# Patient Record
Sex: Male | Born: 1935
Health system: Southern US, Community
[De-identification: ages and names within clinical notes are randomized; demographics above are authoritative.]

## PROBLEM LIST (undated history)

## (undated) DIAGNOSIS — C9000 Multiple myeloma not having achieved remission: Secondary | ICD-10-CM

## (undated) DIAGNOSIS — E785 Hyperlipidemia, unspecified: Secondary | ICD-10-CM

## (undated) DIAGNOSIS — I10 Essential (primary) hypertension: Secondary | ICD-10-CM

## (undated) HISTORY — DX: Essential (primary) hypertension: I10

## (undated) HISTORY — PX: JOINT REPLACEMENT: SHX530

## (undated) HISTORY — DX: Hyperlipidemia, unspecified: E78.5

## (undated) HISTORY — PX: EYE SURGERY: SHX253

---

## 2014-05-10 ENCOUNTER — Ambulatory Visit: Payer: Self-pay | Admitting: Gastroenterology

## 2014-06-27 ENCOUNTER — Ambulatory Visit: Payer: Self-pay | Admitting: Ophthalmology

## 2014-06-27 LAB — POTASSIUM: Potassium: 4.6 mmol/L (ref 3.5–5.1)

## 2014-07-25 ENCOUNTER — Ambulatory Visit: Payer: Self-pay | Admitting: Ophthalmology

## 2014-07-25 DIAGNOSIS — M109 Gout, unspecified: Secondary | ICD-10-CM | POA: Diagnosis not present

## 2014-07-25 DIAGNOSIS — H2511 Age-related nuclear cataract, right eye: Secondary | ICD-10-CM | POA: Diagnosis not present

## 2014-07-25 DIAGNOSIS — Z882 Allergy status to sulfonamides status: Secondary | ICD-10-CM | POA: Diagnosis not present

## 2014-07-25 DIAGNOSIS — Z96641 Presence of right artificial hip joint: Secondary | ICD-10-CM | POA: Diagnosis not present

## 2014-07-25 DIAGNOSIS — M1990 Unspecified osteoarthritis, unspecified site: Secondary | ICD-10-CM | POA: Diagnosis not present

## 2014-07-25 DIAGNOSIS — Z7982 Long term (current) use of aspirin: Secondary | ICD-10-CM | POA: Diagnosis not present

## 2014-07-25 DIAGNOSIS — Z8546 Personal history of malignant neoplasm of prostate: Secondary | ICD-10-CM | POA: Diagnosis not present

## 2014-07-25 DIAGNOSIS — Z96652 Presence of left artificial knee joint: Secondary | ICD-10-CM | POA: Diagnosis not present

## 2014-07-25 DIAGNOSIS — E780 Pure hypercholesterolemia: Secondary | ICD-10-CM | POA: Diagnosis not present

## 2014-07-25 DIAGNOSIS — R609 Edema, unspecified: Secondary | ICD-10-CM | POA: Diagnosis not present

## 2014-07-25 DIAGNOSIS — I10 Essential (primary) hypertension: Secondary | ICD-10-CM | POA: Diagnosis not present

## 2014-07-25 DIAGNOSIS — Z79899 Other long term (current) drug therapy: Secondary | ICD-10-CM | POA: Diagnosis not present

## 2014-07-25 DIAGNOSIS — K30 Functional dyspepsia: Secondary | ICD-10-CM | POA: Diagnosis not present

## 2014-07-25 DIAGNOSIS — Z885 Allergy status to narcotic agent status: Secondary | ICD-10-CM | POA: Diagnosis not present

## 2014-07-25 DIAGNOSIS — H2513 Age-related nuclear cataract, bilateral: Secondary | ICD-10-CM | POA: Diagnosis not present

## 2014-08-23 ENCOUNTER — Ambulatory Visit: Payer: Self-pay | Admitting: Ophthalmology

## 2014-08-23 DIAGNOSIS — Z01812 Encounter for preprocedural laboratory examination: Secondary | ICD-10-CM | POA: Diagnosis not present

## 2014-08-23 DIAGNOSIS — H2512 Age-related nuclear cataract, left eye: Secondary | ICD-10-CM | POA: Diagnosis not present

## 2014-09-03 ENCOUNTER — Ambulatory Visit: Payer: Self-pay | Admitting: Ophthalmology

## 2014-09-03 DIAGNOSIS — Z96659 Presence of unspecified artificial knee joint: Secondary | ICD-10-CM | POA: Diagnosis not present

## 2014-09-03 DIAGNOSIS — Z79899 Other long term (current) drug therapy: Secondary | ICD-10-CM | POA: Diagnosis not present

## 2014-09-03 DIAGNOSIS — M1990 Unspecified osteoarthritis, unspecified site: Secondary | ICD-10-CM | POA: Diagnosis not present

## 2014-09-03 DIAGNOSIS — Z8546 Personal history of malignant neoplasm of prostate: Secondary | ICD-10-CM | POA: Diagnosis not present

## 2014-09-03 DIAGNOSIS — I10 Essential (primary) hypertension: Secondary | ICD-10-CM | POA: Diagnosis not present

## 2014-09-03 DIAGNOSIS — M109 Gout, unspecified: Secondary | ICD-10-CM | POA: Diagnosis not present

## 2014-09-03 DIAGNOSIS — Z96649 Presence of unspecified artificial hip joint: Secondary | ICD-10-CM | POA: Diagnosis not present

## 2014-09-03 DIAGNOSIS — H269 Unspecified cataract: Secondary | ICD-10-CM | POA: Diagnosis not present

## 2014-09-03 DIAGNOSIS — Z882 Allergy status to sulfonamides status: Secondary | ICD-10-CM | POA: Diagnosis not present

## 2014-09-03 DIAGNOSIS — Z7982 Long term (current) use of aspirin: Secondary | ICD-10-CM | POA: Diagnosis not present

## 2014-09-03 DIAGNOSIS — Z885 Allergy status to narcotic agent status: Secondary | ICD-10-CM | POA: Diagnosis not present

## 2014-09-03 DIAGNOSIS — R609 Edema, unspecified: Secondary | ICD-10-CM | POA: Diagnosis not present

## 2014-09-03 DIAGNOSIS — H2512 Age-related nuclear cataract, left eye: Secondary | ICD-10-CM | POA: Diagnosis not present

## 2014-10-09 DIAGNOSIS — I129 Hypertensive chronic kidney disease with stage 1 through stage 4 chronic kidney disease, or unspecified chronic kidney disease: Secondary | ICD-10-CM | POA: Diagnosis not present

## 2014-10-09 DIAGNOSIS — M1A00X0 Idiopathic chronic gout, unspecified site, without tophus (tophi): Secondary | ICD-10-CM | POA: Diagnosis not present

## 2014-10-09 DIAGNOSIS — I10 Essential (primary) hypertension: Secondary | ICD-10-CM | POA: Diagnosis not present

## 2014-10-09 DIAGNOSIS — D631 Anemia in chronic kidney disease: Secondary | ICD-10-CM | POA: Diagnosis not present

## 2014-10-09 DIAGNOSIS — E785 Hyperlipidemia, unspecified: Secondary | ICD-10-CM | POA: Diagnosis not present

## 2014-10-09 DIAGNOSIS — L859 Epidermal thickening, unspecified: Secondary | ICD-10-CM | POA: Diagnosis not present

## 2014-10-09 DIAGNOSIS — L989 Disorder of the skin and subcutaneous tissue, unspecified: Secondary | ICD-10-CM | POA: Diagnosis not present

## 2014-11-09 DIAGNOSIS — I129 Hypertensive chronic kidney disease with stage 1 through stage 4 chronic kidney disease, or unspecified chronic kidney disease: Secondary | ICD-10-CM | POA: Diagnosis not present

## 2014-11-09 DIAGNOSIS — I10 Essential (primary) hypertension: Secondary | ICD-10-CM | POA: Diagnosis not present

## 2014-11-09 DIAGNOSIS — N183 Chronic kidney disease, stage 3 (moderate): Secondary | ICD-10-CM | POA: Diagnosis not present

## 2014-11-11 NOTE — Op Note (Signed)
PATIENT NAME:  Rodney Newton, KRAPF MR#:  [id number] DATE OF BIRTH:  1934/05/16  DATE OF PROCEDURE:  07/25/2014  PREOPERATIVE DIAGNOSIS: Cataract, right eye.   POSTOPERATIVE DIAGNOSIS:  Cataract, right eye.  PROCEDURE PERFORMED: Extracapsular cataract extraction using phacoemulsification with placement Alcon [id number], 20.0 diopter posterior chamber lens, serial number [id number].   ANESTHESIA: 4% lidocaine and 0.75% Marcaine a 50-50 mixture with 10 units/mL Hylenex given as a peribulbar.   SURGEON:  Loura Back. Sondra Blixt, MD  ASSISTANT:  None.  ANESTHESIOLOGIST: Gunnar Fusi, MD  COMPLICATIONS: None.   ESTIMATED BLOOD LOSS: Less than 1 mL.   DESCRIPTION OF PROCEDURE:  The patient was brought to the operating room and given a peribulbar block.  The patient was then prepped and draped in the usual fashion.  The vertical rectus muscles were imbricated using 5-0 silk sutures.  These sutures were then clamped to the sterile drapes as bridle sutures.  A limbal peritomy was performed extending two clock hours and hemostasis was obtained with cautery.  A partial thickness scleral groove was made at the surgical limbus and dissected anteriorly in a lamellar dissection using an Alcon crescent knife.  The anterior chamber was entered superonasally with a Superblade and through the lamellar dissection with a 2.6 mm keratome.  DisCoVisc was used to replace the aqueous and a continuous tear capsulorrhexis was carried out.  Hydrodissection and hydrodelineation were carried out with balanced salt and a 27 gauge canula.  The nucleus was rotated to confirm the effectiveness of the hydrodissection.  Phacoemulsification was carried out using a divide-and-conquer technique.  Total ultrasound time was 1 minute and 48 seconds with an average power of 28 percent. CDE of 50.23.  Irrigation/aspiration was used to remove the residual cortex.  DisCoVisc was used to inflate the capsule and the internal incision was  enlarged to 3 mm with the crescent knife.  The intraocular lens was folded and inserted into the capsular bag using the Alcon Acrysert delivery system.  Irrigation/aspiration was used to remove the residual DisCoVisc.  Miostat was injected into the anterior chamber through the paracentesis track to inflate the anterior chamber and induce miosis.  The wound was checked for leaks and none were found. The conjunctiva was closed with cautery and the bridle sutures were removed.  Cefuroxime 1/10th of a millilter containing 1 mg of drug was injected via the paracentesis track was placed in the eye. An eye shield was placed on the eye.  The patient was discharged to the recovery room in good condition.    ____________________________ Loura Back Daegen Berrocal, MD sad:kl D: 07/25/2014 16:12:04 ET T: 07/25/2014 16:57:03 ET JOB#: [id number]  cc: Remo Lipps A. Evyn Kooyman, MD, <Dictator> Martie Lee MD ELECTRONICALLY SIGNED 07/30/2014 13:20

## 2014-11-11 NOTE — Op Note (Signed)
PATIENT NAME:  Rodney Newton, Rodney Newton MR#:  [id number] DATE OF BIRTH:  [birth year]  DATE OF PROCEDURE:  09/03/2014  PREOPERATIVE DIAGNOSIS:  Nuclear sclerotic cataract, left eye.    POSTOPERATIVE DIAGNOSIS:  Nuclear sclerotic cataract, left eye.    PROCEDURE PERFORMED:  Extracapsular cataract extraction using phacoemulsification with placement of an Alcon SN6CWF, 20.0-diopter posterior chamber lens, serial #[id number].  SURGEON:  Loura Back. Karolynn Infantino, MD  ASSISTANT:  None.  ANESTHESIA:  4% lidocaine and 0.75% Marcaine in a 50/50 mixture with 10 units/mL of Hylenex added, given as a peribulbar.   ANESTHESIOLOGIST:  Julianne Handler, MD  COMPLICATIONS:  None.  ESTIMATED BLOOD LOSS:  Less than 1 ml.  DESCRIPTION OF PROCEDURE:  The patient was brought to the operating room and given a peribulbar block.  The patient was then prepped and draped in the usual fashion.  The vertical rectus muscles were imbricated using 5-0 silk sutures.  These sutures were then clamped to the sterile drapes as bridle sutures.  A limbal peritomy was performed extending two clock hours and hemostasis was obtained with cautery.  A partial thickness scleral groove was made at the surgical limbus and dissected anteriorly in a lamellar dissection using an Alcon crescent knife.  The anterior chamber was entered supero-temporally with a Superblade and through the lamellar dissection with a 2.6 mm keratome.  DisCoVisc was used to replace the aqueous and a continuous tear capsulorrhexis was carried out.  Hydrodissection and hydrodelineation were carried out with balanced salt and a 27 gauge canula.  The nucleus was rotated to confirm the effectiveness of the hydrodissection.  Phacoemulsification was carried out using a divide-and-conquer technique.  Total ultrasound time was 1 minute and 48 seconds with an average power of 24.3 percent and CDE of 43.43.  Irrigation/aspiration was used to remove the residual cortex.   DisCoVisc was used to inflate the capsule and the internal incision was enlarged to 3 mm with the crescent knife.  The intraocular lens was folded and inserted into the capsular bag using the AcrySert delivery system. Irrigation/aspiration was used to remove the residual DisCoVisc.  Miostat was injected into the anterior chamber through the paracentesis track to inflate the anterior chamber and induce miosis. A tenth of a milliliter of cefuroxime containing 1 mg of drug was injected via the paracentesis tract. The wound was checked for leaks and none were found. The conjunctiva was closed with cautery and the bridle sutures were removed.  Two drops of 0.3% Vigamox were placed on the eye.   An eye shield was placed on the eye.  The patient was discharged to the recovery room in good condition.  ____________________________ Loura Back Daryle Amis, MD sad:sb D: 09/03/2014 13:14:48 ET T: 09/03/2014 13:20:41 ET JOB#: [id number]  cc: Remo Lipps A. Kowen Kluth, MD, <Dictator> Martie Lee MD ELECTRONICALLY SIGNED 09/03/2014 13:51

## 2015-02-04 DIAGNOSIS — M545 Low back pain: Secondary | ICD-10-CM | POA: Diagnosis not present

## 2015-02-04 DIAGNOSIS — M5416 Radiculopathy, lumbar region: Secondary | ICD-10-CM | POA: Diagnosis not present

## 2015-02-19 DIAGNOSIS — M533 Sacrococcygeal disorders, not elsewhere classified: Secondary | ICD-10-CM | POA: Diagnosis not present

## 2015-03-08 DIAGNOSIS — M5441 Lumbago with sciatica, right side: Secondary | ICD-10-CM | POA: Diagnosis not present

## 2015-03-20 DIAGNOSIS — M533 Sacrococcygeal disorders, not elsewhere classified: Secondary | ICD-10-CM | POA: Diagnosis not present

## 2015-04-04 DIAGNOSIS — M5416 Radiculopathy, lumbar region: Secondary | ICD-10-CM | POA: Diagnosis not present

## 2015-04-04 DIAGNOSIS — M5136 Other intervertebral disc degeneration, lumbar region: Secondary | ICD-10-CM | POA: Diagnosis not present

## 2015-04-11 DIAGNOSIS — Z961 Presence of intraocular lens: Secondary | ICD-10-CM | POA: Diagnosis not present

## 2015-04-12 DIAGNOSIS — E785 Hyperlipidemia, unspecified: Secondary | ICD-10-CM | POA: Insufficient documentation

## 2015-04-12 DIAGNOSIS — I10 Essential (primary) hypertension: Secondary | ICD-10-CM | POA: Insufficient documentation

## 2015-04-15 ENCOUNTER — Ambulatory Visit (INDEPENDENT_AMBULATORY_CARE_PROVIDER_SITE_OTHER): Payer: Medicare Other | Admitting: Family Medicine

## 2015-04-15 ENCOUNTER — Encounter: Payer: Self-pay | Admitting: Family Medicine

## 2015-04-15 VITALS — BP 119/66 | HR 51 | Temp 97.5°F | Ht 64.2 in | Wt 174.0 lb

## 2015-04-15 DIAGNOSIS — I129 Hypertensive chronic kidney disease with stage 1 through stage 4 chronic kidney disease, or unspecified chronic kidney disease: Secondary | ICD-10-CM

## 2015-04-15 DIAGNOSIS — Z0000 Encounter for general adult medical examination without abnormal findings: Secondary | ICD-10-CM | POA: Diagnosis not present

## 2015-04-15 DIAGNOSIS — M10372 Gout due to renal impairment, left ankle and foot: Secondary | ICD-10-CM | POA: Diagnosis not present

## 2015-04-15 DIAGNOSIS — C61 Malignant neoplasm of prostate: Secondary | ICD-10-CM | POA: Diagnosis not present

## 2015-04-15 DIAGNOSIS — N183 Chronic kidney disease, stage 3 unspecified: Secondary | ICD-10-CM

## 2015-04-15 DIAGNOSIS — Z23 Encounter for immunization: Secondary | ICD-10-CM

## 2015-04-15 DIAGNOSIS — M109 Gout, unspecified: Secondary | ICD-10-CM | POA: Insufficient documentation

## 2015-04-15 DIAGNOSIS — D631 Anemia in chronic kidney disease: Secondary | ICD-10-CM | POA: Diagnosis not present

## 2015-04-15 DIAGNOSIS — E785 Hyperlipidemia, unspecified: Secondary | ICD-10-CM

## 2015-04-15 DIAGNOSIS — I10 Essential (primary) hypertension: Secondary | ICD-10-CM | POA: Diagnosis not present

## 2015-04-15 DIAGNOSIS — N189 Chronic kidney disease, unspecified: Secondary | ICD-10-CM

## 2015-04-15 LAB — URINALYSIS, ROUTINE W REFLEX MICROSCOPIC
BILIRUBIN UA: NEGATIVE
Glucose, UA: NEGATIVE
Ketones, UA: NEGATIVE
Leukocytes, UA: NEGATIVE
NITRITE UA: NEGATIVE
PH UA: 5 (ref 5.0–7.5)
Protein, UA: NEGATIVE
RBC UA: NEGATIVE
SPEC GRAV UA: 1.02 (ref 1.005–1.030)
UUROB: 0.2 mg/dL (ref 0.2–1.0)

## 2015-04-15 MED ORDER — QUINAPRIL HCL 40 MG PO TABS: 40.0000 mg | ORAL_TABLET | Freq: Every day | ORAL | Status: DC

## 2015-04-15 MED ORDER — ALLOPURINOL 100 MG PO TABS: 100.0000 mg | ORAL_TABLET | Freq: Every day | ORAL | Status: DC

## 2015-04-15 MED ORDER — ATORVASTATIN CALCIUM 40 MG PO TABS: 40.0000 mg | ORAL_TABLET | Freq: Every day | ORAL | Status: DC

## 2015-04-15 MED ORDER — AMLODIPINE BESYLATE 10 MG PO TABS: 10.0000 mg | ORAL_TABLET | Freq: Every day | ORAL | Status: DC

## 2015-04-15 MED ORDER — CARVEDILOL 25 MG PO TABS: 50.0000 mg | ORAL_TABLET | Freq: Two times a day (BID) | ORAL | Status: DC

## 2015-04-15 NOTE — Assessment & Plan Note (Signed)
Followed by nephrology. 

## 2015-04-15 NOTE — Assessment & Plan Note (Signed)
The current medical regimen is effective;  continue present plan and medications.  

## 2015-04-15 NOTE — Assessment & Plan Note (Signed)
Checking labs today

## 2015-04-15 NOTE — Progress Notes (Signed)
BP 119/66 mmHg  Pulse 51  Temp(Src) 97.5 F (36.4 C)  Ht 5' 4.2" (1.631 m)  Wt 174 lb (78.926 kg)  BMI 29.67 kg/m2  SpO2 99%   Subjective:    Patient ID: Rodney Newton, male    DOB: Dec 01, 1933, 81 y.o.   MRN: [id number]  HPI: Rodney Newton is a 81 y.o. male  Chief Complaint  Patient presents with  . Annual Exam   patient doing well except for back pain. Back pain is been followed at [facility] in the middle of workup with MRI etc. has had some shots with no real relief having some radicular symptoms most likely degenerative disc disease.  Patient followed at nephrology on chart review last notes from 2015 patient with no complaints from anemia and stated his last renal function was slightly improved. On chart review did gave him some Naprosyn but is not taking that for his back due to his kidneys. I encouraged him to stay off the Naprosyn.  Blood pressure is maintained good with no complaints from medications No fluid retention from amodiine there side efs takesmedicines fitully.  Doing well with Lipitor for cholesterol  Patient not taking tramadol or gabapentin anymore reviewed from medicine last  Relevant past medical, surgical, family and social history reviewed and updated as indicated. Interim medical history since our last visit reviewed. Allergies and medications reviewed and updated.  Review of Systems  Constitutional: Negative.   HENT: Negative.   Eyes: Negative.   Respiratory: Negative.   Cardiovascular: Negative.   Gastrointestinal: Negative.   Endocrine: Negative.   Genitourinary: Negative.   Musculoskeletal: Negative.   Skin: Negative.   Allergic/Immunologic: Negative.   Neurological: Negative.   Hematological: Negative.   Psychiatric/Behavioral: Negative.     Per HPI unless specifically indicated above     Objective:    BP 119/66 mmHg  Pulse 51  Temp(Src) 97.5 F (36.4 C)  Ht 5' 4.2" (1.631 m)  Wt 174 lb (78.926 kg)  BMI 29.67 kg/m2  SpO2  99%  Wt Readings from Last 3 Encounters:  04/15/15 174 lb (78.926 kg)  10/09/14 187 lb (84.823 kg)    Physical Exam  Constitutional: He is oriented to person, place, and time. He appears well-developed and well-nourished.  HENT:  Head: Normocephalic and atraumatic.  Right Ear: External ear normal.  Left Ear: External ear normal.  Eyes: Conjunctivae and EOM are normal. Pupils are equal, round, and reactive to light.  Neck: Normal range of motion. Neck supple.  Cardiovascular: Normal rate, regular rhythm, normal heart sounds and intact distal pulses.   Pulmonary/Chest: Effort normal and breath sounds normal.  Abdominal: Soft. Bowel sounds are normal. There is no splenomegaly or hepatomegaly.  Genitourinary: Rectum normal and penis normal.  Musculoskeletal: Normal range of motion.  Neurological: He is alert and oriented to person, place, and time. He has normal reflexes.  Skin: No rash noted. No erythema.  Psychiatric: He has a normal mood and affect. His behavior is normal. Judgment and thought content normal.    Results for orders placed or performed in visit on 06/27/14  Potassium  Result Value Ref Range   Potassium 4.6 3.5-5.1 mmol/L      Assessment & Plan:   Problem List Items Addressed This Visit      Cardiovascular and Mediastinum   Hypertension    The current medical regimen is effective;  continue present plan and medications.       Relevant Medications   aspirin EC 81  MG tablet   quinapril (ACCUPRIL) 40 MG tablet   carvedilol (COREG) 25 MG tablet   atorvastatin (LIPITOR) 40 MG tablet   amLODipine (NORVASC) 10 MG tablet   Other Relevant Orders   Comprehensive metabolic panel   TSH     Genitourinary   Hypertensive kidney disease with stage 3 chronic kidney disease    Followed by nephrology      Relevant Medications   quinapril (ACCUPRIL) 40 MG tablet   carvedilol (COREG) 25 MG tablet   amLODipine (NORVASC) 10 MG tablet   Other Relevant Orders    Comprehensive metabolic panel   Urinalysis, Routine w reflex microscopic (not at [facility])   TSH   Anemia of chronic renal failure    Checking labs today      Relevant Medications   vitamin B-12 (CYANOCOBALAMIN) 1000 MCG tablet   Other Relevant Orders   CBC with Differential/Platelet   Urinalysis, Routine w reflex microscopic (not at [facility])   TSH   Prostate CA ([place])   Relevant Medications   aspirin EC 81 MG tablet   Other Relevant Orders   TSH     Other   Hyperlipidemia    The current medical regimen is effective;  continue present plan and medications.       Relevant Medications   aspirin EC 81 MG tablet   quinapril (ACCUPRIL) 40 MG tablet   carvedilol (COREG) 25 MG tablet   atorvastatin (LIPITOR) 40 MG tablet   amLODipine (NORVASC) 10 MG tablet   Other Relevant Orders   Comprehensive metabolic panel   Lipid panel   TSH   Gout    The current medical regimen is effective;  continue present plan and medications.       Relevant Medications   allopurinol (ZYLOPRIM) 100 MG tablet   Other Relevant Orders   Comprehensive metabolic panel   TSH    Other Visit Diagnoses    PE (physical exam), annual    -  Primary    Immunization due        Relevant Orders    Flu Vaccine QUAD 36+ mos PF IM (Fluarix & Fluzone Quad PF)       Medicare wellness visit metrics all done Follow up plan: Return in about 6 months (around 10/14/2015), or if symptoms worsen or fail to improve, for BMP, CBC, lipid panel, ALT, AST.

## 2015-04-16 ENCOUNTER — Other Ambulatory Visit: Payer: Self-pay | Admitting: Family Medicine

## 2015-04-16 DIAGNOSIS — D649 Anemia, unspecified: Secondary | ICD-10-CM

## 2015-04-16 LAB — CBC WITH DIFFERENTIAL/PLATELET
BASOS ABS: 0 10*3/uL (ref 0.0–0.2)
Basos: 0 %
EOS (ABSOLUTE): 0.1 10*3/uL (ref 0.0–0.4)
Eos: 3 %
HEMOGLOBIN: 8.9 g/dL — AB (ref 12.6–17.7)
Hematocrit: 26.8 % — ABNORMAL LOW (ref 37.5–51.0)
Immature Grans (Abs): 0 10*3/uL (ref 0.0–0.1)
Immature Granulocytes: 1 %
LYMPHS ABS: 0.8 10*3/uL (ref 0.7–3.1)
Lymphs: 27 %
MCH: 36.6 pg — AB (ref 26.6–33.0)
MCHC: 33.2 g/dL (ref 31.5–35.7)
MCV: 110 fL — ABNORMAL HIGH (ref 79–97)
MONOCYTES: 10 %
MONOS ABS: 0.3 10*3/uL (ref 0.1–0.9)
NEUTROS ABS: 1.7 10*3/uL (ref 1.4–7.0)
Neutrophils: 59 %
Platelets: 262 10*3/uL (ref 150–379)
RBC: 2.43 x10E6/uL — CL (ref 4.14–5.80)
RDW: 15.2 % (ref 12.3–15.4)
WBC: 2.9 10*3/uL — ABNORMAL LOW (ref 3.4–10.8)

## 2015-04-16 LAB — COMPREHENSIVE METABOLIC PANEL
ALBUMIN: 3.4 g/dL — AB (ref 3.5–4.8)
ALT: 17 IU/L (ref 0–44)
AST: 11 IU/L (ref 0–40)
Albumin/Globulin Ratio: 0.7 — ABNORMAL LOW (ref 1.1–2.5)
Alkaline Phosphatase: 118 IU/L — ABNORMAL HIGH (ref 39–117)
BUN / CREAT RATIO: 16 (ref 10–22)
BUN: 26 mg/dL (ref 8–27)
Bilirubin Total: 0.4 mg/dL (ref 0.0–1.2)
CALCIUM: 9.5 mg/dL (ref 8.6–10.2)
CO2: 19 mmol/L (ref 18–29)
CREATININE: 1.67 mg/dL — AB (ref 0.76–1.27)
Chloride: 96 mmol/L — ABNORMAL LOW (ref 97–108)
GFR calc Af Amer: 44 mL/min/{1.73_m2} — ABNORMAL LOW (ref >59)
GFR, EST NON AFRICAN AMERICAN: 38 mL/min/{1.73_m2} — AB (ref >59)
GLOBULIN, TOTAL: 4.7 g/dL — AB (ref 1.5–4.5)
GLUCOSE: 120 mg/dL — AB (ref 65–99)
Potassium: 4.9 mmol/L (ref 3.5–5.2)
SODIUM: 128 mmol/L — AB (ref 134–144)
TOTAL PROTEIN: 8.1 g/dL (ref 6.0–8.5)

## 2015-04-16 LAB — LIPID PANEL
CHOL/HDL RATIO: 2.7 ratio (ref 0.0–5.0)
CHOLESTEROL TOTAL: 131 mg/dL (ref 100–199)
HDL: 49 mg/dL (ref >39)
LDL CALC: 61 mg/dL (ref 0–99)
Triglycerides: 106 mg/dL (ref 0–149)
VLDL CHOLESTEROL CAL: 21 mg/dL (ref 5–40)

## 2015-04-16 LAB — TSH: TSH: 2.26 u[IU]/mL (ref 0.450–4.500)

## 2015-04-17 DIAGNOSIS — M8448XA Pathological fracture, other site, initial encounter for fracture: Secondary | ICD-10-CM | POA: Diagnosis not present

## 2015-04-17 DIAGNOSIS — M4806 Spinal stenosis, lumbar region: Secondary | ICD-10-CM | POA: Diagnosis not present

## 2015-04-17 DIAGNOSIS — M5137 Other intervertebral disc degeneration, lumbosacral region: Secondary | ICD-10-CM | POA: Diagnosis not present

## 2015-04-17 DIAGNOSIS — M5136 Other intervertebral disc degeneration, lumbar region: Secondary | ICD-10-CM | POA: Diagnosis not present

## 2015-04-18 ENCOUNTER — Other Ambulatory Visit: Payer: Medicare Other

## 2015-04-18 DIAGNOSIS — D649 Anemia, unspecified: Secondary | ICD-10-CM | POA: Diagnosis not present

## 2015-04-19 ENCOUNTER — Encounter: Payer: Self-pay | Admitting: Family Medicine

## 2015-04-19 ENCOUNTER — Telehealth: Payer: Self-pay | Admitting: Family Medicine

## 2015-04-19 LAB — CBC WITH DIFFERENTIAL/PLATELET
BASOS: 0 %
Basophils Absolute: 0 10*3/uL (ref 0.0–0.2)
EOS (ABSOLUTE): 0 10*3/uL (ref 0.0–0.4)
EOS: 1 %
HEMATOCRIT: 28.9 % — AB (ref 37.5–51.0)
HEMOGLOBIN: 9.5 g/dL — AB (ref 12.6–17.7)
IMMATURE GRANULOCYTES: 0 %
Immature Grans (Abs): 0 10*3/uL (ref 0.0–0.1)
Lymphocytes Absolute: 1 10*3/uL (ref 0.7–3.1)
Lymphs: 22 %
MCH: 37.3 pg — ABNORMAL HIGH (ref 26.6–33.0)
MCHC: 32.9 g/dL (ref 31.5–35.7)
MCV: 113 fL — AB (ref 79–97)
MONOCYTES: 7 %
MONOS ABS: 0.3 10*3/uL (ref 0.1–0.9)
NEUTROS PCT: 70 %
Neutrophils Absolute: 3 10*3/uL (ref 1.4–7.0)
Platelets: 307 10*3/uL (ref 150–379)
RBC: 2.55 x10E6/uL — AB (ref 4.14–5.80)
RDW: 15.4 % (ref 12.3–15.4)
WBC: 4.3 10*3/uL (ref 3.4–10.8)

## 2015-04-19 LAB — RETICULOCYTES: RETIC CT PCT: 1.5 % (ref 0.6–2.6)

## 2015-04-19 LAB — IRON AND TIBC
IRON: 92 ug/dL (ref 38–169)
Iron Saturation: 38 % (ref 15–55)
TIBC: 239 ug/dL — AB (ref 250–450)
UIBC: 147 ug/dL (ref 111–343)

## 2015-04-19 LAB — FERRITIN: Ferritin: 583 ng/mL — ABNORMAL HIGH (ref 30–400)

## 2015-04-19 NOTE — Telephone Encounter (Signed)
-----   Message from Wynn Maudlin, [place] sent at 04/19/2015 10:18 AM EDT ----- labs

## 2015-04-19 NOTE — Telephone Encounter (Signed)
Phone call Discussed with patient hemoglobin stable but low with no iron deficiency and medications patient has appointment with hematology later this month. Will review with nephrology. Patient may need hematology consult also.

## 2015-04-23 ENCOUNTER — Other Ambulatory Visit: Payer: Self-pay | Admitting: Family Medicine

## 2015-04-24 DIAGNOSIS — C61 Malignant neoplasm of prostate: Secondary | ICD-10-CM | POA: Diagnosis not present

## 2015-04-24 DIAGNOSIS — N189 Chronic kidney disease, unspecified: Secondary | ICD-10-CM | POA: Diagnosis not present

## 2015-04-24 DIAGNOSIS — R222 Localized swelling, mass and lump, trunk: Secondary | ICD-10-CM | POA: Diagnosis not present

## 2015-04-24 DIAGNOSIS — M533 Sacrococcygeal disorders, not elsewhere classified: Secondary | ICD-10-CM | POA: Diagnosis not present

## 2015-04-26 DIAGNOSIS — R938 Abnormal findings on diagnostic imaging of other specified body structures: Secondary | ICD-10-CM | POA: Diagnosis not present

## 2015-04-26 DIAGNOSIS — M8448XA Pathological fracture, other site, initial encounter for fracture: Secondary | ICD-10-CM | POA: Diagnosis not present

## 2015-04-26 DIAGNOSIS — S32059A Unspecified fracture of fifth lumbar vertebra, initial encounter for closed fracture: Secondary | ICD-10-CM | POA: Diagnosis not present

## 2015-05-01 DIAGNOSIS — R222 Localized swelling, mass and lump, trunk: Secondary | ICD-10-CM | POA: Diagnosis not present

## 2015-05-01 DIAGNOSIS — M899 Disorder of bone, unspecified: Secondary | ICD-10-CM | POA: Diagnosis not present

## 2015-05-01 DIAGNOSIS — C61 Malignant neoplasm of prostate: Secondary | ICD-10-CM | POA: Diagnosis not present

## 2015-05-01 DIAGNOSIS — C9000 Multiple myeloma not having achieved remission: Secondary | ICD-10-CM | POA: Diagnosis not present

## 2015-05-01 DIAGNOSIS — Z79899 Other long term (current) drug therapy: Secondary | ICD-10-CM | POA: Diagnosis not present

## 2015-05-01 DIAGNOSIS — Z1379 Encounter for other screening for genetic and chromosomal anomalies: Secondary | ICD-10-CM | POA: Diagnosis not present

## 2015-05-10 DIAGNOSIS — D649 Anemia, unspecified: Secondary | ICD-10-CM | POA: Diagnosis not present

## 2015-05-10 DIAGNOSIS — I10 Essential (primary) hypertension: Secondary | ICD-10-CM | POA: Diagnosis not present

## 2015-05-10 DIAGNOSIS — N183 Chronic kidney disease, stage 3 (moderate): Secondary | ICD-10-CM | POA: Diagnosis not present

## 2015-05-13 DIAGNOSIS — D472 Monoclonal gammopathy: Secondary | ICD-10-CM | POA: Diagnosis not present

## 2015-05-13 DIAGNOSIS — C9000 Multiple myeloma not having achieved remission: Secondary | ICD-10-CM | POA: Diagnosis not present

## 2015-05-15 DIAGNOSIS — C9000 Multiple myeloma not having achieved remission: Secondary | ICD-10-CM | POA: Diagnosis not present

## 2015-05-20 DIAGNOSIS — Z0183 Encounter for blood typing: Secondary | ICD-10-CM | POA: Diagnosis not present

## 2015-05-20 DIAGNOSIS — N183 Chronic kidney disease, stage 3 (moderate): Secondary | ICD-10-CM | POA: Diagnosis not present

## 2015-05-20 DIAGNOSIS — B029 Zoster without complications: Secondary | ICD-10-CM | POA: Diagnosis not present

## 2015-05-20 DIAGNOSIS — Z5111 Encounter for antineoplastic chemotherapy: Secondary | ICD-10-CM | POA: Diagnosis not present

## 2015-05-20 DIAGNOSIS — I10 Essential (primary) hypertension: Secondary | ICD-10-CM | POA: Diagnosis not present

## 2015-05-20 DIAGNOSIS — Z79899 Other long term (current) drug therapy: Secondary | ICD-10-CM | POA: Diagnosis not present

## 2015-05-20 DIAGNOSIS — K5900 Constipation, unspecified: Secondary | ICD-10-CM | POA: Diagnosis not present

## 2015-05-20 DIAGNOSIS — N179 Acute kidney failure, unspecified: Secondary | ICD-10-CM | POA: Diagnosis not present

## 2015-05-20 DIAGNOSIS — K279 Peptic ulcer, site unspecified, unspecified as acute or chronic, without hemorrhage or perforation: Secondary | ICD-10-CM | POA: Diagnosis not present

## 2015-05-20 DIAGNOSIS — C9000 Multiple myeloma not having achieved remission: Secondary | ICD-10-CM | POA: Diagnosis not present

## 2015-05-21 DIAGNOSIS — C9000 Multiple myeloma not having achieved remission: Secondary | ICD-10-CM | POA: Diagnosis not present

## 2015-05-27 DIAGNOSIS — C9000 Multiple myeloma not having achieved remission: Secondary | ICD-10-CM | POA: Diagnosis not present

## 2015-06-03 DIAGNOSIS — C9000 Multiple myeloma not having achieved remission: Secondary | ICD-10-CM | POA: Diagnosis not present

## 2015-06-03 DIAGNOSIS — Z5112 Encounter for antineoplastic immunotherapy: Secondary | ICD-10-CM | POA: Diagnosis not present

## 2015-06-10 DIAGNOSIS — E785 Hyperlipidemia, unspecified: Secondary | ICD-10-CM | POA: Diagnosis not present

## 2015-06-10 DIAGNOSIS — C9000 Multiple myeloma not having achieved remission: Secondary | ICD-10-CM | POA: Diagnosis not present

## 2015-06-10 DIAGNOSIS — I10 Essential (primary) hypertension: Secondary | ICD-10-CM | POA: Diagnosis not present

## 2015-06-10 DIAGNOSIS — D539 Nutritional anemia, unspecified: Secondary | ICD-10-CM | POA: Diagnosis not present

## 2015-06-10 DIAGNOSIS — Z5111 Encounter for antineoplastic chemotherapy: Secondary | ICD-10-CM | POA: Diagnosis not present

## 2015-06-10 DIAGNOSIS — Z5112 Encounter for antineoplastic immunotherapy: Secondary | ICD-10-CM | POA: Diagnosis not present

## 2015-06-17 DIAGNOSIS — C9000 Multiple myeloma not having achieved remission: Secondary | ICD-10-CM | POA: Diagnosis not present

## 2015-06-17 DIAGNOSIS — D649 Anemia, unspecified: Secondary | ICD-10-CM | POA: Diagnosis not present

## 2015-06-24 DIAGNOSIS — C9000 Multiple myeloma not having achieved remission: Secondary | ICD-10-CM | POA: Diagnosis not present

## 2015-07-01 DIAGNOSIS — M545 Low back pain: Secondary | ICD-10-CM | POA: Diagnosis not present

## 2015-07-01 DIAGNOSIS — E785 Hyperlipidemia, unspecified: Secondary | ICD-10-CM | POA: Diagnosis not present

## 2015-07-01 DIAGNOSIS — D539 Nutritional anemia, unspecified: Secondary | ICD-10-CM | POA: Diagnosis not present

## 2015-07-01 DIAGNOSIS — I10 Essential (primary) hypertension: Secondary | ICD-10-CM | POA: Diagnosis not present

## 2015-07-01 DIAGNOSIS — C9000 Multiple myeloma not having achieved remission: Secondary | ICD-10-CM | POA: Diagnosis not present

## 2015-07-01 DIAGNOSIS — K5900 Constipation, unspecified: Secondary | ICD-10-CM | POA: Diagnosis not present

## 2015-07-09 DIAGNOSIS — C9000 Multiple myeloma not having achieved remission: Secondary | ICD-10-CM | POA: Diagnosis not present

## 2015-07-16 DIAGNOSIS — C9000 Multiple myeloma not having achieved remission: Secondary | ICD-10-CM | POA: Diagnosis not present

## 2015-07-25 DIAGNOSIS — K5900 Constipation, unspecified: Secondary | ICD-10-CM | POA: Diagnosis not present

## 2015-07-25 DIAGNOSIS — C9000 Multiple myeloma not having achieved remission: Secondary | ICD-10-CM | POA: Diagnosis not present

## 2015-07-25 DIAGNOSIS — R5382 Chronic fatigue, unspecified: Secondary | ICD-10-CM | POA: Diagnosis not present

## 2015-07-25 DIAGNOSIS — I10 Essential (primary) hypertension: Secondary | ICD-10-CM | POA: Diagnosis not present

## 2015-07-25 DIAGNOSIS — E785 Hyperlipidemia, unspecified: Secondary | ICD-10-CM | POA: Diagnosis not present

## 2015-07-25 DIAGNOSIS — D539 Nutritional anemia, unspecified: Secondary | ICD-10-CM | POA: Diagnosis not present

## 2015-07-25 DIAGNOSIS — Z5111 Encounter for antineoplastic chemotherapy: Secondary | ICD-10-CM | POA: Diagnosis not present

## 2015-07-25 DIAGNOSIS — C9001 Multiple myeloma in remission: Secondary | ICD-10-CM | POA: Diagnosis not present

## 2015-07-31 DIAGNOSIS — C9000 Multiple myeloma not having achieved remission: Secondary | ICD-10-CM | POA: Diagnosis not present

## 2015-08-08 DIAGNOSIS — C9000 Multiple myeloma not having achieved remission: Secondary | ICD-10-CM | POA: Diagnosis not present

## 2015-08-19 DIAGNOSIS — K279 Peptic ulcer, site unspecified, unspecified as acute or chronic, without hemorrhage or perforation: Secondary | ICD-10-CM | POA: Diagnosis not present

## 2015-08-19 DIAGNOSIS — E785 Hyperlipidemia, unspecified: Secondary | ICD-10-CM | POA: Diagnosis not present

## 2015-08-19 DIAGNOSIS — Z0183 Encounter for blood typing: Secondary | ICD-10-CM | POA: Diagnosis not present

## 2015-08-19 DIAGNOSIS — B029 Zoster without complications: Secondary | ICD-10-CM | POA: Diagnosis not present

## 2015-08-19 DIAGNOSIS — Z5111 Encounter for antineoplastic chemotherapy: Secondary | ICD-10-CM | POA: Diagnosis not present

## 2015-08-19 DIAGNOSIS — C9000 Multiple myeloma not having achieved remission: Secondary | ICD-10-CM | POA: Diagnosis not present

## 2015-08-19 DIAGNOSIS — I10 Essential (primary) hypertension: Secondary | ICD-10-CM | POA: Diagnosis not present

## 2015-08-19 DIAGNOSIS — Z7982 Long term (current) use of aspirin: Secondary | ICD-10-CM | POA: Diagnosis not present

## 2015-08-19 DIAGNOSIS — Z79899 Other long term (current) drug therapy: Secondary | ICD-10-CM | POA: Diagnosis not present

## 2015-08-19 DIAGNOSIS — K5900 Constipation, unspecified: Secondary | ICD-10-CM | POA: Diagnosis not present

## 2015-08-19 DIAGNOSIS — D539 Nutritional anemia, unspecified: Secondary | ICD-10-CM | POA: Diagnosis not present

## 2015-08-26 DIAGNOSIS — C9000 Multiple myeloma not having achieved remission: Secondary | ICD-10-CM | POA: Diagnosis not present

## 2015-09-02 DIAGNOSIS — Z114 Encounter for screening for human immunodeficiency virus [HIV]: Secondary | ICD-10-CM | POA: Diagnosis not present

## 2015-09-02 DIAGNOSIS — Z5181 Encounter for therapeutic drug level monitoring: Secondary | ICD-10-CM | POA: Diagnosis not present

## 2015-09-02 DIAGNOSIS — C9000 Multiple myeloma not having achieved remission: Secondary | ICD-10-CM | POA: Diagnosis not present

## 2015-09-02 DIAGNOSIS — Z113 Encounter for screening for infections with a predominantly sexual mode of transmission: Secondary | ICD-10-CM | POA: Diagnosis not present

## 2015-09-02 DIAGNOSIS — Z79899 Other long term (current) drug therapy: Secondary | ICD-10-CM | POA: Diagnosis not present

## 2015-09-02 DIAGNOSIS — Z8546 Personal history of malignant neoplasm of prostate: Secondary | ICD-10-CM | POA: Diagnosis not present

## 2015-09-02 DIAGNOSIS — Z1159 Encounter for screening for other viral diseases: Secondary | ICD-10-CM | POA: Diagnosis not present

## 2015-09-09 DIAGNOSIS — C9000 Multiple myeloma not having achieved remission: Secondary | ICD-10-CM | POA: Diagnosis not present

## 2015-09-09 DIAGNOSIS — Z5111 Encounter for antineoplastic chemotherapy: Secondary | ICD-10-CM | POA: Diagnosis not present

## 2015-09-13 DIAGNOSIS — C9000 Multiple myeloma not having achieved remission: Secondary | ICD-10-CM | POA: Diagnosis not present

## 2015-09-13 DIAGNOSIS — Z452 Encounter for adjustment and management of vascular access device: Secondary | ICD-10-CM | POA: Diagnosis not present

## 2015-09-14 DIAGNOSIS — C9000 Multiple myeloma not having achieved remission: Secondary | ICD-10-CM | POA: Diagnosis not present

## 2015-09-15 DIAGNOSIS — R6889 Other general symptoms and signs: Secondary | ICD-10-CM | POA: Diagnosis not present

## 2015-09-16 DIAGNOSIS — Z9484 Stem cells transplant status: Secondary | ICD-10-CM | POA: Diagnosis not present

## 2015-09-16 DIAGNOSIS — R6889 Other general symptoms and signs: Secondary | ICD-10-CM | POA: Diagnosis not present

## 2015-09-16 DIAGNOSIS — C9000 Multiple myeloma not having achieved remission: Secondary | ICD-10-CM | POA: Diagnosis not present

## 2015-09-16 DIAGNOSIS — Z5181 Encounter for therapeutic drug level monitoring: Secondary | ICD-10-CM | POA: Diagnosis not present

## 2015-09-16 DIAGNOSIS — Z79899 Other long term (current) drug therapy: Secondary | ICD-10-CM | POA: Diagnosis not present

## 2015-09-17 DIAGNOSIS — C9000 Multiple myeloma not having achieved remission: Secondary | ICD-10-CM | POA: Diagnosis not present

## 2015-09-17 DIAGNOSIS — R6889 Other general symptoms and signs: Secondary | ICD-10-CM | POA: Diagnosis not present

## 2015-09-17 DIAGNOSIS — Z9481 Bone marrow transplant status: Secondary | ICD-10-CM | POA: Diagnosis not present

## 2015-09-18 DIAGNOSIS — I824Y1 Acute embolism and thrombosis of unspecified deep veins of right proximal lower extremity: Secondary | ICD-10-CM | POA: Diagnosis not present

## 2015-09-18 DIAGNOSIS — R6889 Other general symptoms and signs: Secondary | ICD-10-CM | POA: Diagnosis not present

## 2015-09-18 DIAGNOSIS — C9000 Multiple myeloma not having achieved remission: Secondary | ICD-10-CM | POA: Diagnosis not present

## 2015-09-19 DIAGNOSIS — R6889 Other general symptoms and signs: Secondary | ICD-10-CM | POA: Diagnosis not present

## 2015-09-20 DIAGNOSIS — R6889 Other general symptoms and signs: Secondary | ICD-10-CM | POA: Diagnosis not present

## 2015-09-21 DIAGNOSIS — Z9484 Stem cells transplant status: Secondary | ICD-10-CM | POA: Diagnosis not present

## 2015-09-21 DIAGNOSIS — R6889 Other general symptoms and signs: Secondary | ICD-10-CM | POA: Diagnosis not present

## 2015-09-21 DIAGNOSIS — C9000 Multiple myeloma not having achieved remission: Secondary | ICD-10-CM | POA: Diagnosis not present

## 2015-09-22 DIAGNOSIS — R6889 Other general symptoms and signs: Secondary | ICD-10-CM | POA: Diagnosis not present

## 2015-09-23 DIAGNOSIS — R6889 Other general symptoms and signs: Secondary | ICD-10-CM | POA: Diagnosis not present

## 2015-09-24 DIAGNOSIS — R6889 Other general symptoms and signs: Secondary | ICD-10-CM | POA: Diagnosis not present

## 2015-09-24 DIAGNOSIS — C9000 Multiple myeloma not having achieved remission: Secondary | ICD-10-CM | POA: Diagnosis not present

## 2015-09-24 DIAGNOSIS — Z452 Encounter for adjustment and management of vascular access device: Secondary | ICD-10-CM | POA: Diagnosis not present

## 2015-09-24 DIAGNOSIS — Z9481 Bone marrow transplant status: Secondary | ICD-10-CM | POA: Diagnosis not present

## 2015-09-25 DIAGNOSIS — C9000 Multiple myeloma not having achieved remission: Secondary | ICD-10-CM | POA: Diagnosis not present

## 2015-09-25 DIAGNOSIS — M47816 Spondylosis without myelopathy or radiculopathy, lumbar region: Secondary | ICD-10-CM | POA: Diagnosis not present

## 2015-09-25 DIAGNOSIS — R6889 Other general symptoms and signs: Secondary | ICD-10-CM | POA: Diagnosis not present

## 2015-09-25 DIAGNOSIS — M4806 Spinal stenosis, lumbar region: Secondary | ICD-10-CM | POA: Diagnosis not present

## 2015-09-26 DIAGNOSIS — Z713 Dietary counseling and surveillance: Secondary | ICD-10-CM | POA: Diagnosis not present

## 2015-09-26 DIAGNOSIS — Z9481 Bone marrow transplant status: Secondary | ICD-10-CM | POA: Diagnosis not present

## 2015-09-26 DIAGNOSIS — E785 Hyperlipidemia, unspecified: Secondary | ICD-10-CM | POA: Diagnosis not present

## 2015-09-26 DIAGNOSIS — R432 Parageusia: Secondary | ICD-10-CM | POA: Diagnosis not present

## 2015-09-26 DIAGNOSIS — M79661 Pain in right lower leg: Secondary | ICD-10-CM | POA: Diagnosis not present

## 2015-09-26 DIAGNOSIS — R6889 Other general symptoms and signs: Secondary | ICD-10-CM | POA: Diagnosis not present

## 2015-09-26 DIAGNOSIS — C9000 Multiple myeloma not having achieved remission: Secondary | ICD-10-CM | POA: Diagnosis not present

## 2015-09-26 DIAGNOSIS — I10 Essential (primary) hypertension: Secondary | ICD-10-CM | POA: Diagnosis not present

## 2015-09-26 DIAGNOSIS — Z5111 Encounter for antineoplastic chemotherapy: Secondary | ICD-10-CM | POA: Diagnosis not present

## 2015-09-26 DIAGNOSIS — D509 Iron deficiency anemia, unspecified: Secondary | ICD-10-CM | POA: Diagnosis not present

## 2015-09-27 DIAGNOSIS — Z9481 Bone marrow transplant status: Secondary | ICD-10-CM | POA: Diagnosis not present

## 2015-09-27 DIAGNOSIS — R6889 Other general symptoms and signs: Secondary | ICD-10-CM | POA: Diagnosis not present

## 2015-09-27 DIAGNOSIS — C9000 Multiple myeloma not having achieved remission: Secondary | ICD-10-CM | POA: Diagnosis not present

## 2015-09-27 DIAGNOSIS — I10 Essential (primary) hypertension: Secondary | ICD-10-CM | POA: Diagnosis not present

## 2015-09-28 DIAGNOSIS — C9000 Multiple myeloma not having achieved remission: Secondary | ICD-10-CM | POA: Diagnosis not present

## 2015-09-28 DIAGNOSIS — Z9481 Bone marrow transplant status: Secondary | ICD-10-CM | POA: Diagnosis not present

## 2015-09-28 DIAGNOSIS — R6889 Other general symptoms and signs: Secondary | ICD-10-CM | POA: Diagnosis not present

## 2015-09-28 DIAGNOSIS — Z48298 Encounter for aftercare following other organ transplant: Secondary | ICD-10-CM | POA: Diagnosis not present

## 2015-09-28 DIAGNOSIS — Z79899 Other long term (current) drug therapy: Secondary | ICD-10-CM | POA: Diagnosis not present

## 2015-09-28 DIAGNOSIS — Z9484 Stem cells transplant status: Secondary | ICD-10-CM | POA: Diagnosis not present

## 2015-09-29 DIAGNOSIS — C9000 Multiple myeloma not having achieved remission: Secondary | ICD-10-CM | POA: Diagnosis not present

## 2015-09-29 DIAGNOSIS — R6889 Other general symptoms and signs: Secondary | ICD-10-CM | POA: Diagnosis not present

## 2015-09-29 DIAGNOSIS — Z9481 Bone marrow transplant status: Secondary | ICD-10-CM | POA: Diagnosis not present

## 2015-09-30 DIAGNOSIS — C9000 Multiple myeloma not having achieved remission: Secondary | ICD-10-CM | POA: Diagnosis not present

## 2015-09-30 DIAGNOSIS — R6889 Other general symptoms and signs: Secondary | ICD-10-CM | POA: Diagnosis not present

## 2015-09-30 DIAGNOSIS — Z9481 Bone marrow transplant status: Secondary | ICD-10-CM | POA: Diagnosis not present

## 2015-10-01 DIAGNOSIS — R6889 Other general symptoms and signs: Secondary | ICD-10-CM | POA: Diagnosis not present

## 2015-10-01 DIAGNOSIS — Z9481 Bone marrow transplant status: Secondary | ICD-10-CM | POA: Diagnosis not present

## 2015-10-01 DIAGNOSIS — C9000 Multiple myeloma not having achieved remission: Secondary | ICD-10-CM | POA: Diagnosis not present

## 2015-10-02 DIAGNOSIS — Z9481 Bone marrow transplant status: Secondary | ICD-10-CM | POA: Diagnosis not present

## 2015-10-02 DIAGNOSIS — C9000 Multiple myeloma not having achieved remission: Secondary | ICD-10-CM | POA: Diagnosis not present

## 2015-10-02 DIAGNOSIS — R6889 Other general symptoms and signs: Secondary | ICD-10-CM | POA: Diagnosis not present

## 2015-10-03 DIAGNOSIS — Z9481 Bone marrow transplant status: Secondary | ICD-10-CM | POA: Diagnosis not present

## 2015-10-03 DIAGNOSIS — C9000 Multiple myeloma not having achieved remission: Secondary | ICD-10-CM | POA: Diagnosis not present

## 2015-10-03 DIAGNOSIS — R6889 Other general symptoms and signs: Secondary | ICD-10-CM | POA: Diagnosis not present

## 2015-10-04 DIAGNOSIS — R21 Rash and other nonspecific skin eruption: Secondary | ICD-10-CM | POA: Diagnosis not present

## 2015-10-04 DIAGNOSIS — C9000 Multiple myeloma not having achieved remission: Secondary | ICD-10-CM | POA: Diagnosis not present

## 2015-10-04 DIAGNOSIS — R6889 Other general symptoms and signs: Secondary | ICD-10-CM | POA: Diagnosis not present

## 2015-10-04 DIAGNOSIS — Z9481 Bone marrow transplant status: Secondary | ICD-10-CM | POA: Diagnosis not present

## 2015-10-05 DIAGNOSIS — E785 Hyperlipidemia, unspecified: Secondary | ICD-10-CM | POA: Diagnosis not present

## 2015-10-05 DIAGNOSIS — R6889 Other general symptoms and signs: Secondary | ICD-10-CM | POA: Diagnosis not present

## 2015-10-05 DIAGNOSIS — L738 Other specified follicular disorders: Secondary | ICD-10-CM | POA: Diagnosis not present

## 2015-10-05 DIAGNOSIS — Z9481 Bone marrow transplant status: Secondary | ICD-10-CM | POA: Diagnosis not present

## 2015-10-05 DIAGNOSIS — D703 Neutropenia due to infection: Secondary | ICD-10-CM | POA: Diagnosis not present

## 2015-10-05 DIAGNOSIS — C9000 Multiple myeloma not having achieved remission: Secondary | ICD-10-CM | POA: Diagnosis not present

## 2015-10-05 DIAGNOSIS — M79604 Pain in right leg: Secondary | ICD-10-CM | POA: Diagnosis not present

## 2015-10-05 DIAGNOSIS — I10 Essential (primary) hypertension: Secondary | ICD-10-CM | POA: Diagnosis not present

## 2015-10-05 DIAGNOSIS — Z9484 Stem cells transplant status: Secondary | ICD-10-CM | POA: Diagnosis not present

## 2015-10-05 DIAGNOSIS — Z79899 Other long term (current) drug therapy: Secondary | ICD-10-CM | POA: Diagnosis not present

## 2015-10-05 DIAGNOSIS — R110 Nausea: Secondary | ICD-10-CM | POA: Diagnosis not present

## 2015-10-06 DIAGNOSIS — R52 Pain, unspecified: Secondary | ICD-10-CM | POA: Diagnosis not present

## 2015-10-06 DIAGNOSIS — C9000 Multiple myeloma not having achieved remission: Secondary | ICD-10-CM | POA: Diagnosis not present

## 2015-10-06 DIAGNOSIS — L738 Other specified follicular disorders: Secondary | ICD-10-CM | POA: Diagnosis not present

## 2015-10-06 DIAGNOSIS — Z9484 Stem cells transplant status: Secondary | ICD-10-CM | POA: Diagnosis not present

## 2015-10-06 DIAGNOSIS — Z9481 Bone marrow transplant status: Secondary | ICD-10-CM | POA: Diagnosis not present

## 2015-10-06 DIAGNOSIS — R6889 Other general symptoms and signs: Secondary | ICD-10-CM | POA: Diagnosis not present

## 2015-10-07 DIAGNOSIS — E871 Hypo-osmolality and hyponatremia: Secondary | ICD-10-CM | POA: Diagnosis not present

## 2015-10-07 DIAGNOSIS — Z9484 Stem cells transplant status: Secondary | ICD-10-CM | POA: Diagnosis not present

## 2015-10-07 DIAGNOSIS — M5136 Other intervertebral disc degeneration, lumbar region: Secondary | ICD-10-CM | POA: Diagnosis not present

## 2015-10-07 DIAGNOSIS — K219 Gastro-esophageal reflux disease without esophagitis: Secondary | ICD-10-CM | POA: Diagnosis not present

## 2015-10-07 DIAGNOSIS — R197 Diarrhea, unspecified: Secondary | ICD-10-CM | POA: Diagnosis not present

## 2015-10-07 DIAGNOSIS — R918 Other nonspecific abnormal finding of lung field: Secondary | ICD-10-CM | POA: Diagnosis not present

## 2015-10-07 DIAGNOSIS — L738 Other specified follicular disorders: Secondary | ICD-10-CM | POA: Diagnosis not present

## 2015-10-07 DIAGNOSIS — E785 Hyperlipidemia, unspecified: Secondary | ICD-10-CM | POA: Diagnosis not present

## 2015-10-07 DIAGNOSIS — D61810 Antineoplastic chemotherapy induced pancytopenia: Secondary | ICD-10-CM | POA: Diagnosis not present

## 2015-10-07 DIAGNOSIS — R6889 Other general symptoms and signs: Secondary | ICD-10-CM | POA: Diagnosis not present

## 2015-10-07 DIAGNOSIS — Z882 Allergy status to sulfonamides status: Secondary | ICD-10-CM | POA: Diagnosis not present

## 2015-10-07 DIAGNOSIS — I129 Hypertensive chronic kidney disease with stage 1 through stage 4 chronic kidney disease, or unspecified chronic kidney disease: Secondary | ICD-10-CM | POA: Diagnosis not present

## 2015-10-07 DIAGNOSIS — K649 Unspecified hemorrhoids: Secondary | ICD-10-CM | POA: Diagnosis not present

## 2015-10-07 DIAGNOSIS — C9000 Multiple myeloma not having achieved remission: Secondary | ICD-10-CM | POA: Diagnosis not present

## 2015-10-07 DIAGNOSIS — R5081 Fever presenting with conditions classified elsewhere: Secondary | ICD-10-CM | POA: Diagnosis not present

## 2015-10-07 DIAGNOSIS — N189 Chronic kidney disease, unspecified: Secondary | ICD-10-CM | POA: Diagnosis not present

## 2015-10-07 DIAGNOSIS — M109 Gout, unspecified: Secondary | ICD-10-CM | POA: Diagnosis not present

## 2015-10-07 DIAGNOSIS — D539 Nutritional anemia, unspecified: Secondary | ICD-10-CM | POA: Diagnosis not present

## 2015-10-07 DIAGNOSIS — D709 Neutropenia, unspecified: Secondary | ICD-10-CM | POA: Diagnosis not present

## 2015-10-07 DIAGNOSIS — Z9481 Bone marrow transplant status: Secondary | ICD-10-CM | POA: Diagnosis not present

## 2015-10-11 DIAGNOSIS — C9000 Multiple myeloma not having achieved remission: Secondary | ICD-10-CM | POA: Diagnosis not present

## 2015-10-11 DIAGNOSIS — R6889 Other general symptoms and signs: Secondary | ICD-10-CM | POA: Diagnosis not present

## 2015-10-11 DIAGNOSIS — Z9481 Bone marrow transplant status: Secondary | ICD-10-CM | POA: Diagnosis not present

## 2015-10-12 DIAGNOSIS — C9000 Multiple myeloma not having achieved remission: Secondary | ICD-10-CM | POA: Diagnosis not present

## 2015-10-12 DIAGNOSIS — Z9481 Bone marrow transplant status: Secondary | ICD-10-CM | POA: Diagnosis not present

## 2015-10-12 DIAGNOSIS — Z452 Encounter for adjustment and management of vascular access device: Secondary | ICD-10-CM | POA: Diagnosis not present

## 2015-10-12 DIAGNOSIS — R6889 Other general symptoms and signs: Secondary | ICD-10-CM | POA: Diagnosis not present

## 2015-10-12 DIAGNOSIS — Z79899 Other long term (current) drug therapy: Secondary | ICD-10-CM | POA: Diagnosis not present

## 2015-10-14 ENCOUNTER — Ambulatory Visit: Payer: Self-pay | Admitting: Family Medicine

## 2015-10-16 DIAGNOSIS — E876 Hypokalemia: Secondary | ICD-10-CM | POA: Diagnosis not present

## 2015-10-16 DIAGNOSIS — Z9484 Stem cells transplant status: Secondary | ICD-10-CM | POA: Diagnosis not present

## 2015-10-16 DIAGNOSIS — E785 Hyperlipidemia, unspecified: Secondary | ICD-10-CM | POA: Diagnosis not present

## 2015-10-16 DIAGNOSIS — M47816 Spondylosis without myelopathy or radiculopathy, lumbar region: Secondary | ICD-10-CM | POA: Diagnosis not present

## 2015-10-16 DIAGNOSIS — I10 Essential (primary) hypertension: Secondary | ICD-10-CM | POA: Diagnosis not present

## 2015-10-16 DIAGNOSIS — C9000 Multiple myeloma not having achieved remission: Secondary | ICD-10-CM | POA: Diagnosis not present

## 2015-10-16 DIAGNOSIS — M4806 Spinal stenosis, lumbar region: Secondary | ICD-10-CM | POA: Diagnosis not present

## 2015-10-17 DIAGNOSIS — M4807 Spinal stenosis, lumbosacral region: Secondary | ICD-10-CM | POA: Diagnosis not present

## 2015-10-17 DIAGNOSIS — R296 Repeated falls: Secondary | ICD-10-CM | POA: Diagnosis not present

## 2015-10-17 DIAGNOSIS — C9000 Multiple myeloma not having achieved remission: Secondary | ICD-10-CM | POA: Diagnosis not present

## 2015-10-17 DIAGNOSIS — D530 Protein deficiency anemia: Secondary | ICD-10-CM | POA: Diagnosis not present

## 2015-10-19 DIAGNOSIS — C9000 Multiple myeloma not having achieved remission: Secondary | ICD-10-CM | POA: Diagnosis not present

## 2015-10-19 DIAGNOSIS — Z9481 Bone marrow transplant status: Secondary | ICD-10-CM | POA: Diagnosis not present

## 2015-10-21 DIAGNOSIS — M4807 Spinal stenosis, lumbosacral region: Secondary | ICD-10-CM | POA: Diagnosis not present

## 2015-10-21 DIAGNOSIS — C9000 Multiple myeloma not having achieved remission: Secondary | ICD-10-CM | POA: Diagnosis not present

## 2015-10-21 DIAGNOSIS — R296 Repeated falls: Secondary | ICD-10-CM | POA: Diagnosis not present

## 2015-10-21 DIAGNOSIS — D530 Protein deficiency anemia: Secondary | ICD-10-CM | POA: Diagnosis not present

## 2015-10-22 DIAGNOSIS — C9000 Multiple myeloma not having achieved remission: Secondary | ICD-10-CM | POA: Diagnosis not present

## 2015-10-22 DIAGNOSIS — R296 Repeated falls: Secondary | ICD-10-CM | POA: Diagnosis not present

## 2015-10-22 DIAGNOSIS — M4807 Spinal stenosis, lumbosacral region: Secondary | ICD-10-CM | POA: Diagnosis not present

## 2015-10-22 DIAGNOSIS — D530 Protein deficiency anemia: Secondary | ICD-10-CM | POA: Diagnosis not present

## 2015-10-23 DIAGNOSIS — R29898 Other symptoms and signs involving the musculoskeletal system: Secondary | ICD-10-CM | POA: Diagnosis not present

## 2015-10-23 DIAGNOSIS — Z9481 Bone marrow transplant status: Secondary | ICD-10-CM | POA: Diagnosis not present

## 2015-10-23 DIAGNOSIS — C9000 Multiple myeloma not having achieved remission: Secondary | ICD-10-CM | POA: Diagnosis not present

## 2015-10-23 DIAGNOSIS — M4806 Spinal stenosis, lumbar region: Secondary | ICD-10-CM | POA: Diagnosis not present

## 2015-10-24 ENCOUNTER — Encounter: Admission: RE | Admit: 2015-10-24 | Payer: Self-pay | Source: Ambulatory Visit | Admitting: Internal Medicine

## 2015-10-24 ENCOUNTER — Encounter
Admission: RE | Admit: 2015-10-24 | Discharge: 2015-10-24 | Disposition: A | Discharge status: Home / Self Care | Payer: Medicare Other | Source: Ambulatory Visit | Attending: Internal Medicine | Admitting: Internal Medicine

## 2015-10-24 DIAGNOSIS — Z9484 Stem cells transplant status: Secondary | ICD-10-CM | POA: Diagnosis not present

## 2015-10-24 DIAGNOSIS — M4806 Spinal stenosis, lumbar region: Secondary | ICD-10-CM | POA: Diagnosis not present

## 2015-10-24 DIAGNOSIS — R531 Weakness: Secondary | ICD-10-CM | POA: Diagnosis not present

## 2015-10-24 DIAGNOSIS — C9000 Multiple myeloma not having achieved remission: Secondary | ICD-10-CM | POA: Diagnosis not present

## 2015-10-24 DIAGNOSIS — C9001 Multiple myeloma in remission: Secondary | ICD-10-CM | POA: Diagnosis not present

## 2015-10-24 DIAGNOSIS — M4800 Spinal stenosis, site unspecified: Secondary | ICD-10-CM | POA: Diagnosis not present

## 2015-10-24 DIAGNOSIS — R262 Difficulty in walking, not elsewhere classified: Secondary | ICD-10-CM | POA: Diagnosis not present

## 2015-10-24 DIAGNOSIS — Z9181 History of falling: Secondary | ICD-10-CM | POA: Diagnosis not present

## 2015-10-24 DIAGNOSIS — I10 Essential (primary) hypertension: Secondary | ICD-10-CM | POA: Diagnosis not present

## 2015-11-11 ENCOUNTER — Encounter
Admission: RE | Admit: 2015-11-11 | Discharge: 2015-11-11 | Disposition: A | Discharge status: Home / Self Care | Payer: Self-pay | Source: Ambulatory Visit | Attending: Internal Medicine | Admitting: Internal Medicine

## 2015-11-13 DIAGNOSIS — C9000 Multiple myeloma not having achieved remission: Secondary | ICD-10-CM | POA: Diagnosis not present

## 2015-11-13 DIAGNOSIS — C9001 Multiple myeloma in remission: Secondary | ICD-10-CM | POA: Diagnosis not present

## 2015-11-18 ENCOUNTER — Emergency Department
Admission: EM | Admit: 2015-11-18 | Discharge: 2015-11-18 | Disposition: A | Discharge status: Home / Self Care | Payer: Medicare Other | Attending: Emergency Medicine | Admitting: Emergency Medicine

## 2015-11-18 ENCOUNTER — Encounter: Payer: Self-pay | Admitting: Emergency Medicine

## 2015-11-18 ENCOUNTER — Emergency Department: Payer: Medicare Other

## 2015-11-18 DIAGNOSIS — E785 Hyperlipidemia, unspecified: Secondary | ICD-10-CM | POA: Diagnosis not present

## 2015-11-18 DIAGNOSIS — N183 Chronic kidney disease, stage 3 (moderate): Secondary | ICD-10-CM | POA: Diagnosis not present

## 2015-11-18 DIAGNOSIS — K5900 Constipation, unspecified: Secondary | ICD-10-CM

## 2015-11-18 DIAGNOSIS — I129 Hypertensive chronic kidney disease with stage 1 through stage 4 chronic kidney disease, or unspecified chronic kidney disease: Secondary | ICD-10-CM | POA: Insufficient documentation

## 2015-11-18 DIAGNOSIS — Z79899 Other long term (current) drug therapy: Secondary | ICD-10-CM | POA: Insufficient documentation

## 2015-11-18 DIAGNOSIS — K649 Unspecified hemorrhoids: Secondary | ICD-10-CM | POA: Diagnosis not present

## 2015-11-18 DIAGNOSIS — Z7982 Long term (current) use of aspirin: Secondary | ICD-10-CM | POA: Insufficient documentation

## 2015-11-18 HISTORY — DX: Multiple myeloma not having achieved remission: C90.00

## 2015-11-18 LAB — CBC WITH DIFFERENTIAL/PLATELET
Basophils Absolute: 0.1 10*3/uL (ref 0–0.1)
Basophils Relative: 1 %
Eosinophils Absolute: 0.3 10*3/uL (ref 0–0.7)
HCT: 33.2 % — ABNORMAL LOW (ref 40.0–52.0)
Hemoglobin: 11.3 g/dL — ABNORMAL LOW (ref 13.0–18.0)
LYMPHS ABS: 1.5 10*3/uL (ref 1.0–3.6)
MCH: 32.4 pg (ref 26.0–34.0)
MCHC: 34.1 g/dL (ref 32.0–36.0)
MCV: 95 fL (ref 80.0–100.0)
MONO ABS: 1 10*3/uL (ref 0.2–1.0)
Monocytes Relative: 11 %
Neutro Abs: 6.4 10*3/uL (ref 1.4–6.5)
Neutrophils Relative %: 69 %
PLATELETS: 258 10*3/uL (ref 150–440)
RBC: 3.49 MIL/uL — ABNORMAL LOW (ref 4.40–5.90)
RDW: 18.5 % — AB (ref 11.5–14.5)
WBC: 9.3 10*3/uL (ref 3.8–10.6)

## 2015-11-18 LAB — COMPREHENSIVE METABOLIC PANEL
ALT: 15 U/L — ABNORMAL LOW (ref 17–63)
AST: 17 U/L (ref 15–41)
Albumin: 3.7 g/dL (ref 3.5–5.0)
Alkaline Phosphatase: 72 U/L (ref 38–126)
Anion gap: 7 (ref 5–15)
BUN: 32 mg/dL — ABNORMAL HIGH (ref 6–20)
CHLORIDE: 103 mmol/L (ref 101–111)
CO2: 25 mmol/L (ref 22–32)
CREATININE: 0.99 mg/dL (ref 0.61–1.24)
Calcium: 9.8 mg/dL (ref 8.9–10.3)
Glucose, Bld: 105 mg/dL — ABNORMAL HIGH (ref 65–99)
POTASSIUM: 4.9 mmol/L (ref 3.5–5.1)
Sodium: 135 mmol/L (ref 135–145)
Total Bilirubin: 0.6 mg/dL (ref 0.3–1.2)
Total Protein: 6.9 g/dL (ref 6.5–8.1)

## 2015-11-18 MED ORDER — POLYETHYLENE GLYCOL 3350 17 GM/SCOOP PO POWD: 17.0000 g | Freq: Every day | ORAL | Status: DC

## 2015-11-18 NOTE — ED Notes (Signed)
Large amount of stool eliminated after administration of soap suds enema.

## 2015-11-18 NOTE — ED Notes (Addendum)
States no BM x 4 days, states took enema today and had small results but did not feel relief an started hemorrhoids bleeding. States finished chemo March 2017 for multiple myeloma and taking pain pills.

## 2015-11-18 NOTE — ED Provider Notes (Signed)
[facility] Emergency Department Provider Note  Time seen: 3:31 PM  I have reviewed the triage vital signs and the nursing notes.   HISTORY  Chief Complaint Constipation    HPI KYAL ARTS is a 82 y.o. male with a past medical history of hypertension, hyperlipidemia, multiple myeloma who presents the emergency department with constipation. According to the patient he has not had a bowel movement in 4 days which is somewhat atypical for him. He states he took a fleets enema tonight had a very small bowel movement but required a lot of straining. He states he wiped and saw some blood which is consistent with his hemorrhoids so he came to the emergency department for evaluation. Right now states mild hemorrhoid pain, denies any abdominal pain. Denies any black stool. Patient states he finished chemotherapy 2 months ago, is on pain medication currently which is likely contributed to his constipation.     Past Medical History  Diagnosis Date  . Hypertension   . Hyperlipidemia   . Multiple myeloma [facility])     Patient Active Problem List   Diagnosis Date Noted  . Hypertensive kidney disease with stage 3 chronic kidney disease 04/15/2015  . Gout 04/15/2015  . Anemia of chronic renal failure 04/15/2015  . Prostate CA ([place]) 04/15/2015  . Hypertension   . Hyperlipidemia     Past Surgical History  Procedure Laterality Date  . Joint replacement Bilateral     knees  . Eye surgery Bilateral     cataract    Current Outpatient Rx  Name  Route  Sig  Dispense  Refill  . allopurinol (ZYLOPRIM) 100 MG tablet   Oral   Take 1 tablet (100 mg total) by mouth daily.   90 tablet   4   . amLODipine (NORVASC) 10 MG tablet      TAKE 1 TABLET BY MOUTH EVERY DAY   90 tablet   0   . aspirin EC 81 MG tablet   Oral   Take by mouth.         Marland Kitchen atorvastatin (LIPITOR) 40 MG tablet   Oral   Take 1 tablet (40 mg total) by mouth daily.   90 tablet   4   .  carvedilol (COREG) 25 MG tablet   Oral   Take 2 tablets (50 mg total) by mouth 2 (two) times daily with a meal.   180 tablet   4   . furosemide (LASIX) 20 MG tablet   Oral   Take 20 mg by mouth daily as needed.         . quinapril (ACCUPRIL) 40 MG tablet   Oral   Take 1 tablet (40 mg total) by mouth at bedtime.   90 tablet   4   . tizanidine (ZANAFLEX) 2 MG capsule   Oral   Take by mouth.         . vitamin B-12 (CYANOCOBALAMIN) 1000 MCG tablet   Oral   Take 1,000 mcg by mouth daily.           Allergies Hydrocodone; Codeine; and Sulfa antibiotics  Family History  Problem Relation Age of Onset  . Cancer Mother   . Cancer Father   . Cancer Brother   . Heart attack Brother   . Heart attack Brother     Social History Social History  Substance Use Topics  . Smoking status: Never Smoker   . Smokeless tobacco: Never Used  . Alcohol Use: Yes  Comment: occasional    Review of Systems Constitutional: Negative for fever. Cardiovascular: Negative for chest pain. Respiratory: Negative for shortness of breath. Gastrointestinal: Negative for abdominal pain. Positive for hemorrhoids. Genitourinary: Negative for dysuria. Musculoskeletal: Negative for back pain Neurological: Negative for headache 10-point ROS otherwise negative.  ____________________________________________   PHYSICAL EXAM:  VITAL SIGNS: ED Triage Vitals  Enc Vitals Group     BP 11/18/15 1409 113/86 mmHg     Pulse Rate 11/18/15 1409 85     Resp 11/18/15 1409 20     Temp 11/18/15 1409 98.2 F (36.8 C)     Temp Source 11/18/15 1409 Oral     SpO2 11/18/15 1409 100 %     Weight 11/18/15 1409 142 lb (64.411 kg)     Height 11/18/15 1409 5' 7" (1.702 m)     Head Cir --      Peak Flow --      Pain Score 11/18/15 1413 6     Pain Loc --      Pain Edu? --      Excl. in [place]? --     Constitutional: Alert and oriented. Well appearing and in no distress. Eyes: Normal exam ENT   Head:  Normocephalic and atraumatic.   Mouth/Throat: Mucous membranes are moist. Cardiovascular: Normal rate, regular rhythm. No murmur Respiratory: Normal respiratory effort without tachypnea nor retractions. Breath sounds are clear  Gastrointestinal: Soft and nontender. No distention. Musculoskeletal: Nontender with normal range of motion in all extremities.  Neurologic:  Normal speech and language. No gross focal neurologic deficits  Skin:  Skin is warm, dry and intact.  Psychiatric: Mood and affect are normal. Speech and behavior are normal.   ____________________________________________     RADIOLOGY  Abdominal x-ray shows no acute abnormality     INITIAL IMPRESSION / ASSESSMENT AND PLAN / ED COURSE  Pertinent labs & imaging results that were available during my care of the patient were reviewed by me and considered in my medical decision making (see chart for details).  The patient presents the emergency department for constipation 4 days. Patient states he used a fleets enema today and strained very hard. When he wiped he could feel his hemorrhoids were inflamed and he saw small amount of blood on the toilet paper so he called a meal answering to the emergency department. Denies any black stool. Denies any abdominal pain, nausea, vomiting. On exam patient has a nontender abdomen. He does have moderate sized external hemorrhoids, none of which are thrombosed on exam. No active bleeding. We will obtain labs, abdominal x-ray to rule out obstructive gas pattern, and likely proceed with enema in the emergency department. I discussed with the patient the use of laxatives such as MiraLAX and attempting not to strain.  Abdominal x-ray shows no acute abnormality. Labs are within normal limits. After enema patient had a very large bowel movement, is feeling much better. We'll discharge on MiraLAX to be taken as needed, and primary care follow-up. Patient agreeable to  plan.  ____________________________________________   FINAL CLINICAL IMPRESSION(S) / ED DIAGNOSES  Constipation Hemorrhoids  Harvest Dark, MD 11/18/15 1651

## 2015-11-18 NOTE — ED Notes (Signed)
Pt discharged home after verbalizing understanding of discharge instructions; nad noted. 

## 2015-11-18 NOTE — Discharge Instructions (Signed)

## 2015-11-21 DIAGNOSIS — M8448XD Pathological fracture, other site, subsequent encounter for fracture with routine healing: Secondary | ICD-10-CM | POA: Diagnosis not present

## 2015-11-21 DIAGNOSIS — M4806 Spinal stenosis, lumbar region: Secondary | ICD-10-CM | POA: Diagnosis not present

## 2015-11-21 DIAGNOSIS — I10 Essential (primary) hypertension: Secondary | ICD-10-CM | POA: Diagnosis not present

## 2015-11-21 DIAGNOSIS — E785 Hyperlipidemia, unspecified: Secondary | ICD-10-CM | POA: Diagnosis not present

## 2015-11-21 DIAGNOSIS — Z9221 Personal history of antineoplastic chemotherapy: Secondary | ICD-10-CM | POA: Diagnosis not present

## 2015-11-21 DIAGNOSIS — C9000 Multiple myeloma not having achieved remission: Secondary | ICD-10-CM | POA: Diagnosis not present

## 2015-11-21 DIAGNOSIS — D539 Nutritional anemia, unspecified: Secondary | ICD-10-CM | POA: Diagnosis not present

## 2015-11-21 DIAGNOSIS — Z9181 History of falling: Secondary | ICD-10-CM | POA: Diagnosis not present

## 2015-11-21 DIAGNOSIS — Z9484 Stem cells transplant status: Secondary | ICD-10-CM | POA: Diagnosis not present

## 2015-11-21 DIAGNOSIS — M5136 Other intervertebral disc degeneration, lumbar region: Secondary | ICD-10-CM | POA: Diagnosis not present

## 2015-11-21 DIAGNOSIS — M533 Sacrococcygeal disorders, not elsewhere classified: Secondary | ICD-10-CM | POA: Diagnosis not present

## 2015-11-22 ENCOUNTER — Telehealth: Payer: Self-pay | Admitting: Family Medicine

## 2015-11-22 DIAGNOSIS — Z9181 History of falling: Secondary | ICD-10-CM | POA: Diagnosis not present

## 2015-11-22 DIAGNOSIS — I10 Essential (primary) hypertension: Secondary | ICD-10-CM | POA: Diagnosis not present

## 2015-11-22 DIAGNOSIS — Z9484 Stem cells transplant status: Secondary | ICD-10-CM | POA: Diagnosis not present

## 2015-11-22 DIAGNOSIS — M4806 Spinal stenosis, lumbar region: Secondary | ICD-10-CM | POA: Diagnosis not present

## 2015-11-22 DIAGNOSIS — M8448XD Pathological fracture, other site, subsequent encounter for fracture with routine healing: Secondary | ICD-10-CM | POA: Diagnosis not present

## 2015-11-22 DIAGNOSIS — Z9221 Personal history of antineoplastic chemotherapy: Secondary | ICD-10-CM | POA: Diagnosis not present

## 2015-11-22 DIAGNOSIS — M5136 Other intervertebral disc degeneration, lumbar region: Secondary | ICD-10-CM | POA: Diagnosis not present

## 2015-11-22 DIAGNOSIS — M533 Sacrococcygeal disorders, not elsewhere classified: Secondary | ICD-10-CM | POA: Diagnosis not present

## 2015-11-22 DIAGNOSIS — C9000 Multiple myeloma not having achieved remission: Secondary | ICD-10-CM | POA: Diagnosis not present

## 2015-11-22 DIAGNOSIS — E785 Hyperlipidemia, unspecified: Secondary | ICD-10-CM | POA: Diagnosis not present

## 2015-11-22 DIAGNOSIS — D539 Nutritional anemia, unspecified: Secondary | ICD-10-CM | POA: Diagnosis not present

## 2015-11-22 NOTE — Telephone Encounter (Signed)
Joelene Millin from [facility] called and would like to get a order for the pt to receive home health PT 2 times a week for 1 week and 3 times a week for 5 weeks. She would also like to verify that the start of care date was 11/21/2015.

## 2015-11-25 ENCOUNTER — Telehealth: Payer: Self-pay | Admitting: Family Medicine

## 2015-11-25 DIAGNOSIS — M4806 Spinal stenosis, lumbar region: Secondary | ICD-10-CM | POA: Diagnosis not present

## 2015-11-25 DIAGNOSIS — Z9181 History of falling: Secondary | ICD-10-CM | POA: Diagnosis not present

## 2015-11-25 DIAGNOSIS — D539 Nutritional anemia, unspecified: Secondary | ICD-10-CM | POA: Diagnosis not present

## 2015-11-25 DIAGNOSIS — Z9221 Personal history of antineoplastic chemotherapy: Secondary | ICD-10-CM | POA: Diagnosis not present

## 2015-11-25 DIAGNOSIS — Z9484 Stem cells transplant status: Secondary | ICD-10-CM | POA: Diagnosis not present

## 2015-11-25 DIAGNOSIS — M533 Sacrococcygeal disorders, not elsewhere classified: Secondary | ICD-10-CM | POA: Diagnosis not present

## 2015-11-25 DIAGNOSIS — E785 Hyperlipidemia, unspecified: Secondary | ICD-10-CM | POA: Diagnosis not present

## 2015-11-25 DIAGNOSIS — M8448XD Pathological fracture, other site, subsequent encounter for fracture with routine healing: Secondary | ICD-10-CM | POA: Diagnosis not present

## 2015-11-25 DIAGNOSIS — C9000 Multiple myeloma not having achieved remission: Secondary | ICD-10-CM | POA: Diagnosis not present

## 2015-11-25 DIAGNOSIS — I10 Essential (primary) hypertension: Secondary | ICD-10-CM | POA: Diagnosis not present

## 2015-11-25 DIAGNOSIS — M5136 Other intervertebral disc degeneration, lumbar region: Secondary | ICD-10-CM | POA: Diagnosis not present

## 2015-11-25 NOTE — Telephone Encounter (Signed)
Status: Signed       Expand All Collapse All   Joelene Millin from [facility] called and would like to get a order for the pt to receive home health PT 2 times a week for 1 week and 3 times a week for 5 weeks. She would also like to verify that the start of care date was 11/21/2015.       ok

## 2015-11-25 NOTE — Telephone Encounter (Signed)
Status: Signed       Expand All Collapse All   Pt called stated he needs an RX for a bed rail for his bed as is it to high for him. The therapist recommended this for this pt. Pt would like his wife to come pick this RX so they can get it ASAP. Please call pt when RX is ready for pick up. Thanks.       ok

## 2015-11-25 NOTE — Telephone Encounter (Signed)
Pt called stated he needs an RX for a bed rail for his bed as is it to high for him. The therapist recommended this for this pt. Pt would like his wife to come pick this RX so they can get it ASAP. Please call pt when RX is ready for pick up. Thanks.

## 2015-11-25 NOTE — Telephone Encounter (Signed)
Called and left VM with verbal ok

## 2015-11-25 NOTE — Telephone Encounter (Signed)
Rx written for bed rail  Patient understands that we will need a face-to-face visit for Medicare His appointment is 12/02/15

## 2015-11-27 DIAGNOSIS — M5136 Other intervertebral disc degeneration, lumbar region: Secondary | ICD-10-CM | POA: Diagnosis not present

## 2015-11-27 DIAGNOSIS — Z9484 Stem cells transplant status: Secondary | ICD-10-CM | POA: Diagnosis not present

## 2015-11-27 DIAGNOSIS — C9000 Multiple myeloma not having achieved remission: Secondary | ICD-10-CM | POA: Diagnosis not present

## 2015-11-27 DIAGNOSIS — M4806 Spinal stenosis, lumbar region: Secondary | ICD-10-CM | POA: Diagnosis not present

## 2015-11-27 DIAGNOSIS — M8448XD Pathological fracture, other site, subsequent encounter for fracture with routine healing: Secondary | ICD-10-CM | POA: Diagnosis not present

## 2015-11-27 DIAGNOSIS — D539 Nutritional anemia, unspecified: Secondary | ICD-10-CM | POA: Diagnosis not present

## 2015-11-27 DIAGNOSIS — E785 Hyperlipidemia, unspecified: Secondary | ICD-10-CM | POA: Diagnosis not present

## 2015-11-27 DIAGNOSIS — Z9181 History of falling: Secondary | ICD-10-CM | POA: Diagnosis not present

## 2015-11-27 DIAGNOSIS — Z9221 Personal history of antineoplastic chemotherapy: Secondary | ICD-10-CM | POA: Diagnosis not present

## 2015-11-27 DIAGNOSIS — M533 Sacrococcygeal disorders, not elsewhere classified: Secondary | ICD-10-CM | POA: Diagnosis not present

## 2015-11-27 DIAGNOSIS — I10 Essential (primary) hypertension: Secondary | ICD-10-CM | POA: Diagnosis not present

## 2015-11-28 DIAGNOSIS — Z9481 Bone marrow transplant status: Secondary | ICD-10-CM | POA: Diagnosis not present

## 2015-11-28 DIAGNOSIS — R29898 Other symptoms and signs involving the musculoskeletal system: Secondary | ICD-10-CM | POA: Diagnosis not present

## 2015-11-28 DIAGNOSIS — M5136 Other intervertebral disc degeneration, lumbar region: Secondary | ICD-10-CM | POA: Diagnosis not present

## 2015-11-28 DIAGNOSIS — C9000 Multiple myeloma not having achieved remission: Secondary | ICD-10-CM | POA: Diagnosis not present

## 2015-11-29 ENCOUNTER — Telehealth: Payer: Self-pay | Admitting: Family Medicine

## 2015-11-29 DIAGNOSIS — M5136 Other intervertebral disc degeneration, lumbar region: Secondary | ICD-10-CM | POA: Diagnosis not present

## 2015-11-29 DIAGNOSIS — M4806 Spinal stenosis, lumbar region: Secondary | ICD-10-CM | POA: Diagnosis not present

## 2015-11-29 DIAGNOSIS — Z9484 Stem cells transplant status: Secondary | ICD-10-CM | POA: Diagnosis not present

## 2015-11-29 DIAGNOSIS — M533 Sacrococcygeal disorders, not elsewhere classified: Secondary | ICD-10-CM | POA: Diagnosis not present

## 2015-11-29 DIAGNOSIS — E785 Hyperlipidemia, unspecified: Secondary | ICD-10-CM | POA: Diagnosis not present

## 2015-11-29 DIAGNOSIS — Z9181 History of falling: Secondary | ICD-10-CM | POA: Diagnosis not present

## 2015-11-29 DIAGNOSIS — M8448XD Pathological fracture, other site, subsequent encounter for fracture with routine healing: Secondary | ICD-10-CM | POA: Diagnosis not present

## 2015-11-29 DIAGNOSIS — D539 Nutritional anemia, unspecified: Secondary | ICD-10-CM | POA: Diagnosis not present

## 2015-11-29 DIAGNOSIS — Z9221 Personal history of antineoplastic chemotherapy: Secondary | ICD-10-CM | POA: Diagnosis not present

## 2015-11-29 DIAGNOSIS — I10 Essential (primary) hypertension: Secondary | ICD-10-CM | POA: Diagnosis not present

## 2015-11-29 DIAGNOSIS — C9000 Multiple myeloma not having achieved remission: Secondary | ICD-10-CM | POA: Diagnosis not present

## 2015-11-29 NOTE — Telephone Encounter (Signed)
Rodney Newton from [facility] called and would like to get an order for occupational therapy 2 times a week for 5 weeks.

## 2015-11-29 NOTE — Telephone Encounter (Signed)
Routing to provider  

## 2015-12-02 ENCOUNTER — Ambulatory Visit (INDEPENDENT_AMBULATORY_CARE_PROVIDER_SITE_OTHER): Payer: Medicare Other | Admitting: Family Medicine

## 2015-12-02 ENCOUNTER — Encounter: Payer: Self-pay | Admitting: Family Medicine

## 2015-12-02 VITALS — BP 163/74 | HR 97 | Temp 97.7°F

## 2015-12-02 DIAGNOSIS — I10 Essential (primary) hypertension: Secondary | ICD-10-CM

## 2015-12-02 DIAGNOSIS — M533 Sacrococcygeal disorders, not elsewhere classified: Secondary | ICD-10-CM | POA: Diagnosis not present

## 2015-12-02 DIAGNOSIS — N183 Chronic kidney disease, stage 3 (moderate): Secondary | ICD-10-CM | POA: Diagnosis not present

## 2015-12-02 DIAGNOSIS — D631 Anemia in chronic kidney disease: Secondary | ICD-10-CM

## 2015-12-02 DIAGNOSIS — D539 Nutritional anemia, unspecified: Secondary | ICD-10-CM | POA: Diagnosis not present

## 2015-12-02 DIAGNOSIS — Z9221 Personal history of antineoplastic chemotherapy: Secondary | ICD-10-CM | POA: Diagnosis not present

## 2015-12-02 DIAGNOSIS — Z9484 Stem cells transplant status: Secondary | ICD-10-CM | POA: Diagnosis not present

## 2015-12-02 DIAGNOSIS — G5793 Unspecified mononeuropathy of bilateral lower limbs: Secondary | ICD-10-CM | POA: Diagnosis not present

## 2015-12-02 DIAGNOSIS — M5136 Other intervertebral disc degeneration, lumbar region: Secondary | ICD-10-CM | POA: Diagnosis not present

## 2015-12-02 DIAGNOSIS — G5790 Unspecified mononeuropathy of unspecified lower limb: Secondary | ICD-10-CM | POA: Insufficient documentation

## 2015-12-02 DIAGNOSIS — C9000 Multiple myeloma not having achieved remission: Secondary | ICD-10-CM | POA: Insufficient documentation

## 2015-12-02 DIAGNOSIS — E785 Hyperlipidemia, unspecified: Secondary | ICD-10-CM | POA: Diagnosis not present

## 2015-12-02 DIAGNOSIS — Z9181 History of falling: Secondary | ICD-10-CM | POA: Diagnosis not present

## 2015-12-02 DIAGNOSIS — M8448XD Pathological fracture, other site, subsequent encounter for fracture with routine healing: Secondary | ICD-10-CM | POA: Diagnosis not present

## 2015-12-02 DIAGNOSIS — M4806 Spinal stenosis, lumbar region: Secondary | ICD-10-CM | POA: Diagnosis not present

## 2015-12-02 MED ORDER — GABAPENTIN 300 MG PO CAPS: 300.0000 mg | ORAL_CAPSULE | Freq: Three times a day (TID) | ORAL | Status: AC

## 2015-12-02 NOTE — Telephone Encounter (Signed)
Verbal called in

## 2015-12-02 NOTE — Addendum Note (Signed)
Addended by: Wynn Maudlin on: 12/02/2015 01:53 PM   Modules accepted: Orders, SmartSet

## 2015-12-02 NOTE — Assessment & Plan Note (Signed)
Some elevation but all in all doing well

## 2015-12-02 NOTE — Assessment & Plan Note (Signed)
Discussed neuropathy care and treatment will start with gabapentin 300 mg May increase to 900 mg Will recheck in 1 month to assess therapy change effectiveness both helping with sleep and pain.

## 2015-12-02 NOTE — Progress Notes (Signed)
BP 163/74 mmHg  Pulse 97  Temp(Src) 97.7 F (36.5 C)  Ht   Wt   SpO2 99%   Subjective:    Patient ID: Rodney Newton, male    DOB: 11-23-1933, 82 y.o.   MRN: [id number]  HPI: Rodney Newton is a 82 y.o. male  Chief Complaint  Patient presents with  . Hospitalization Follow-up  . discuss bed rail  . Shoulder Pain    bilateral, right is worse  . foot burning, aching    bilateral   Patient with complicated history assisted by his wife. She was back pain turned out to be multiple myeloma is been treated with stem cell replacement at [facility]. Patient now in a wheelchair due to weakness both from spinal tumor and [place]. Is going through physical therapy to recover his been self-propelling and now is developed right shoulder bursitis pain wants a shot to help that. Patient's other major issue today is peripheral neuropathy pain in both feet. This limits his ability to sleep. He wants to try something for sleep pain or to treat the neuropathy.  Relevant past medical, surgical, family and social history reviewed and updated as indicated. Interim medical history since our last visit reviewed. Allergies and medications reviewed and updated.  Review of Systems  Constitutional: Positive for fatigue. Negative for fever.  Respiratory: Negative.  Negative for cough.   Cardiovascular: Negative.     Per HPI unless specifically indicated above     Objective:    BP 163/74 mmHg  Pulse 97  Temp(Src) 97.7 F (36.5 C)  Ht   Wt   SpO2 99%  Wt Readings from Last 3 Encounters:  11/18/15 142 lb (64.411 kg)  04/15/15 174 lb (78.926 kg)  10/09/14 187 lb (84.823 kg)    Physical Exam  Constitutional: He is oriented to person, place, and time. He appears well-developed and well-nourished. No distress.  Patient in wheelchair  HENT:  Head: Normocephalic and atraumatic.  Right Ear: Hearing normal.  Left Ear: Hearing normal.  Nose: Nose normal.  Eyes: Conjunctivae and lids are normal.  Right eye exhibits no discharge. Left eye exhibits no discharge. No scleral icterus.  Pulmonary/Chest: Effort normal. No respiratory distress.  Musculoskeletal: Normal range of motion.  Right shoulder with bursitis changes prepped with Betadine and alcohol and infiltrated shoulder with Marcaine and Kenalog with relief of symptoms patient tolerated procedure well  Neurological: He is alert and oriented to person, place, and time.  Skin: Skin is intact. No rash noted.  Psychiatric: He has a normal mood and affect. His speech is normal and behavior is normal. Judgment and thought content normal. Cognition and memory are normal.    Results for orders placed or performed during the hospital encounter of 11/18/15  CBC with Differential  Result Value Ref Range   WBC 9.3 3.8 - 10.6 K/uL   RBC 3.49 (L) 4.40 - 5.90 MIL/uL   Hemoglobin 11.3 (L) 13.0 - 18.0 g/dL   HCT 33.2 (L) 40.0 - 52.0 %   MCV 95.0 80.0 - 100.0 fL   MCH 32.4 26.0 - 34.0 pg   MCHC 34.1 32.0 - 36.0 g/dL   RDW 18.5 (H) 11.5 - 14.5 %   Platelets 258 150 - 440 K/uL   Neutrophils Relative % 69% %   Neutro Abs 6.4 1.4 - 6.5 K/uL   Lymphocytes Relative 16% %   Lymphs Abs 1.5 1.0 - 3.6 K/uL   Monocytes Relative 11% %   Monocytes Absolute 1.0  0.2 - 1.0 K/uL   Eosinophils Relative 3% %   Eosinophils Absolute 0.3 0 - 0.7 K/uL   Basophils Relative 1% %   Basophils Absolute 0.1 0 - 0.1 K/uL  Comprehensive metabolic panel  Result Value Ref Range   Sodium 135 135 - 145 mmol/L   Potassium 4.9 3.5 - 5.1 mmol/L   Chloride 103 101 - 111 mmol/L   CO2 25 22 - 32 mmol/L   Glucose, Bld 105 (H) 65 - 99 mg/dL   BUN 32 (H) 6 - 20 mg/dL   Creatinine, Ser 0.99 0.61 - 1.24 mg/dL   Calcium 9.8 8.9 - 10.3 mg/dL   Total Protein 6.9 6.5 - 8.1 g/dL   Albumin 3.7 3.5 - 5.0 g/dL   AST 17 15 - 41 U/L   ALT 15 (L) 17 - 63 U/L   Alkaline Phosphatase 72 38 - 126 U/L   Total Bilirubin 0.6 0.3 - 1.2 mg/dL   GFR calc non Af Amer >60 >60 mL/min   GFR calc  Af Amer >60 >60 mL/min   Anion gap 7 5 - 15      Assessment & Plan:   Problem List Items Addressed This Visit      Cardiovascular and Mediastinum   Hypertension    Some elevation but all in all doing well      Relevant Medications   carvedilol (COREG) 6.25 MG tablet   Other Relevant Orders   Basic metabolic panel     Nervous and Auditory   Neuropathy, leg    Discussed neuropathy care and treatment will start with gabapentin 300 mg May increase to 900 mg Will recheck in 1 month to assess therapy change effectiveness both helping with sleep and pain.      Relevant Medications   LORazepam (ATIVAN) 1 MG tablet   prochlorperazine (COMPAZINE) 10 MG tablet   gabapentin (NEURONTIN) 300 MG capsule     Genitourinary   Anemia of chronic renal failure - Primary   Relevant Medications   Cyanocobalamin (RA VITAMIN B-12 TR) 1000 MCG TBCR   Other Relevant Orders   CBC With Differential/Platelet     Other   Hyperlipidemia   Relevant Medications   carvedilol (COREG) 6.25 MG tablet   Other Relevant Orders   LP+ALT+AST Piccolo, Waived   Multiple myeloma (HCC)   Relevant Medications   traMADol (ULTRAM-ER) 300 MG 24 hr tablet   LORazepam (ATIVAN) 1 MG tablet   gabapentin (NEURONTIN) 300 MG capsule       Follow up plan: Return in about 4 weeks (around 12/30/2015) for Recheck neuropathy pain and strengthening with home physical therapy which was reviewed.

## 2015-12-02 NOTE — Telephone Encounter (Signed)
Expand All Collapse All   [name] from [facility] called and would like to get an order for occupational therapy 2 times a week for 5 weeks.        ok

## 2015-12-03 DIAGNOSIS — M5136 Other intervertebral disc degeneration, lumbar region: Secondary | ICD-10-CM | POA: Diagnosis not present

## 2015-12-03 DIAGNOSIS — M4806 Spinal stenosis, lumbar region: Secondary | ICD-10-CM | POA: Diagnosis not present

## 2015-12-03 DIAGNOSIS — Z9221 Personal history of antineoplastic chemotherapy: Secondary | ICD-10-CM | POA: Diagnosis not present

## 2015-12-03 DIAGNOSIS — M533 Sacrococcygeal disorders, not elsewhere classified: Secondary | ICD-10-CM | POA: Diagnosis not present

## 2015-12-03 DIAGNOSIS — Z9181 History of falling: Secondary | ICD-10-CM | POA: Diagnosis not present

## 2015-12-03 DIAGNOSIS — C9000 Multiple myeloma not having achieved remission: Secondary | ICD-10-CM | POA: Diagnosis not present

## 2015-12-03 DIAGNOSIS — E785 Hyperlipidemia, unspecified: Secondary | ICD-10-CM | POA: Diagnosis not present

## 2015-12-03 DIAGNOSIS — I10 Essential (primary) hypertension: Secondary | ICD-10-CM | POA: Diagnosis not present

## 2015-12-03 DIAGNOSIS — D539 Nutritional anemia, unspecified: Secondary | ICD-10-CM | POA: Diagnosis not present

## 2015-12-03 DIAGNOSIS — M8448XD Pathological fracture, other site, subsequent encounter for fracture with routine healing: Secondary | ICD-10-CM | POA: Diagnosis not present

## 2015-12-03 DIAGNOSIS — Z9484 Stem cells transplant status: Secondary | ICD-10-CM | POA: Diagnosis not present

## 2015-12-04 DIAGNOSIS — I10 Essential (primary) hypertension: Secondary | ICD-10-CM | POA: Diagnosis not present

## 2015-12-04 DIAGNOSIS — D539 Nutritional anemia, unspecified: Secondary | ICD-10-CM | POA: Diagnosis not present

## 2015-12-04 DIAGNOSIS — Z9221 Personal history of antineoplastic chemotherapy: Secondary | ICD-10-CM | POA: Diagnosis not present

## 2015-12-04 DIAGNOSIS — C9000 Multiple myeloma not having achieved remission: Secondary | ICD-10-CM | POA: Diagnosis not present

## 2015-12-04 DIAGNOSIS — M4806 Spinal stenosis, lumbar region: Secondary | ICD-10-CM | POA: Diagnosis not present

## 2015-12-04 DIAGNOSIS — Z9484 Stem cells transplant status: Secondary | ICD-10-CM | POA: Diagnosis not present

## 2015-12-04 DIAGNOSIS — E785 Hyperlipidemia, unspecified: Secondary | ICD-10-CM | POA: Diagnosis not present

## 2015-12-04 DIAGNOSIS — M533 Sacrococcygeal disorders, not elsewhere classified: Secondary | ICD-10-CM | POA: Diagnosis not present

## 2015-12-04 DIAGNOSIS — M8448XD Pathological fracture, other site, subsequent encounter for fracture with routine healing: Secondary | ICD-10-CM | POA: Diagnosis not present

## 2015-12-04 DIAGNOSIS — Z9181 History of falling: Secondary | ICD-10-CM | POA: Diagnosis not present

## 2015-12-04 DIAGNOSIS — M5136 Other intervertebral disc degeneration, lumbar region: Secondary | ICD-10-CM | POA: Diagnosis not present

## 2015-12-05 DIAGNOSIS — Z9484 Stem cells transplant status: Secondary | ICD-10-CM | POA: Diagnosis not present

## 2015-12-05 DIAGNOSIS — M4806 Spinal stenosis, lumbar region: Secondary | ICD-10-CM | POA: Diagnosis not present

## 2015-12-05 DIAGNOSIS — M8448XD Pathological fracture, other site, subsequent encounter for fracture with routine healing: Secondary | ICD-10-CM | POA: Diagnosis not present

## 2015-12-05 DIAGNOSIS — I10 Essential (primary) hypertension: Secondary | ICD-10-CM | POA: Diagnosis not present

## 2015-12-05 DIAGNOSIS — E785 Hyperlipidemia, unspecified: Secondary | ICD-10-CM | POA: Diagnosis not present

## 2015-12-05 DIAGNOSIS — M5136 Other intervertebral disc degeneration, lumbar region: Secondary | ICD-10-CM | POA: Diagnosis not present

## 2015-12-05 DIAGNOSIS — Z9181 History of falling: Secondary | ICD-10-CM | POA: Diagnosis not present

## 2015-12-05 DIAGNOSIS — C9000 Multiple myeloma not having achieved remission: Secondary | ICD-10-CM | POA: Diagnosis not present

## 2015-12-05 DIAGNOSIS — D539 Nutritional anemia, unspecified: Secondary | ICD-10-CM | POA: Diagnosis not present

## 2015-12-05 DIAGNOSIS — M533 Sacrococcygeal disorders, not elsewhere classified: Secondary | ICD-10-CM | POA: Diagnosis not present

## 2015-12-05 DIAGNOSIS — Z9221 Personal history of antineoplastic chemotherapy: Secondary | ICD-10-CM | POA: Diagnosis not present

## 2015-12-06 DIAGNOSIS — I10 Essential (primary) hypertension: Secondary | ICD-10-CM | POA: Diagnosis not present

## 2015-12-06 DIAGNOSIS — Z9181 History of falling: Secondary | ICD-10-CM | POA: Diagnosis not present

## 2015-12-06 DIAGNOSIS — E785 Hyperlipidemia, unspecified: Secondary | ICD-10-CM | POA: Diagnosis not present

## 2015-12-06 DIAGNOSIS — M8448XD Pathological fracture, other site, subsequent encounter for fracture with routine healing: Secondary | ICD-10-CM | POA: Diagnosis not present

## 2015-12-06 DIAGNOSIS — M533 Sacrococcygeal disorders, not elsewhere classified: Secondary | ICD-10-CM | POA: Diagnosis not present

## 2015-12-06 DIAGNOSIS — Z9221 Personal history of antineoplastic chemotherapy: Secondary | ICD-10-CM | POA: Diagnosis not present

## 2015-12-06 DIAGNOSIS — C9000 Multiple myeloma not having achieved remission: Secondary | ICD-10-CM | POA: Diagnosis not present

## 2015-12-06 DIAGNOSIS — M4806 Spinal stenosis, lumbar region: Secondary | ICD-10-CM | POA: Diagnosis not present

## 2015-12-06 DIAGNOSIS — D539 Nutritional anemia, unspecified: Secondary | ICD-10-CM | POA: Diagnosis not present

## 2015-12-06 DIAGNOSIS — M5136 Other intervertebral disc degeneration, lumbar region: Secondary | ICD-10-CM | POA: Diagnosis not present

## 2015-12-06 DIAGNOSIS — Z9484 Stem cells transplant status: Secondary | ICD-10-CM | POA: Diagnosis not present

## 2015-12-10 ENCOUNTER — Telehealth: Payer: Self-pay | Admitting: Family Medicine

## 2015-12-10 DIAGNOSIS — M5136 Other intervertebral disc degeneration, lumbar region: Secondary | ICD-10-CM | POA: Diagnosis not present

## 2015-12-10 DIAGNOSIS — M533 Sacrococcygeal disorders, not elsewhere classified: Secondary | ICD-10-CM | POA: Diagnosis not present

## 2015-12-10 DIAGNOSIS — Z9181 History of falling: Secondary | ICD-10-CM | POA: Diagnosis not present

## 2015-12-10 DIAGNOSIS — M8448XD Pathological fracture, other site, subsequent encounter for fracture with routine healing: Secondary | ICD-10-CM | POA: Diagnosis not present

## 2015-12-10 DIAGNOSIS — M4806 Spinal stenosis, lumbar region: Secondary | ICD-10-CM | POA: Diagnosis not present

## 2015-12-10 DIAGNOSIS — C9000 Multiple myeloma not having achieved remission: Secondary | ICD-10-CM | POA: Diagnosis not present

## 2015-12-10 DIAGNOSIS — E785 Hyperlipidemia, unspecified: Secondary | ICD-10-CM | POA: Diagnosis not present

## 2015-12-10 DIAGNOSIS — Z9484 Stem cells transplant status: Secondary | ICD-10-CM | POA: Diagnosis not present

## 2015-12-10 DIAGNOSIS — Z9221 Personal history of antineoplastic chemotherapy: Secondary | ICD-10-CM | POA: Diagnosis not present

## 2015-12-10 DIAGNOSIS — I10 Essential (primary) hypertension: Secondary | ICD-10-CM | POA: Diagnosis not present

## 2015-12-10 DIAGNOSIS — D539 Nutritional anemia, unspecified: Secondary | ICD-10-CM | POA: Diagnosis not present

## 2015-12-10 NOTE — Telephone Encounter (Signed)
Pt was given orders from an outside provider and would like to know if he could come here and have them drawn and sent to [facility]. I spoke with [facility] and gave the pt the location of the nearest drawing station but he still asked if I would send a message and make sure it couldn't be done at [facility].

## 2015-12-10 NOTE — Telephone Encounter (Signed)
No outside ordered labs done here, they need to go to a draw center

## 2015-12-11 DIAGNOSIS — M5136 Other intervertebral disc degeneration, lumbar region: Secondary | ICD-10-CM | POA: Diagnosis not present

## 2015-12-11 DIAGNOSIS — D539 Nutritional anemia, unspecified: Secondary | ICD-10-CM | POA: Diagnosis not present

## 2015-12-11 DIAGNOSIS — Z9484 Stem cells transplant status: Secondary | ICD-10-CM | POA: Diagnosis not present

## 2015-12-11 DIAGNOSIS — E785 Hyperlipidemia, unspecified: Secondary | ICD-10-CM | POA: Diagnosis not present

## 2015-12-11 DIAGNOSIS — M533 Sacrococcygeal disorders, not elsewhere classified: Secondary | ICD-10-CM | POA: Diagnosis not present

## 2015-12-11 DIAGNOSIS — Z9221 Personal history of antineoplastic chemotherapy: Secondary | ICD-10-CM | POA: Diagnosis not present

## 2015-12-11 DIAGNOSIS — Z9181 History of falling: Secondary | ICD-10-CM | POA: Diagnosis not present

## 2015-12-11 DIAGNOSIS — M4806 Spinal stenosis, lumbar region: Secondary | ICD-10-CM | POA: Diagnosis not present

## 2015-12-11 DIAGNOSIS — C9000 Multiple myeloma not having achieved remission: Secondary | ICD-10-CM | POA: Diagnosis not present

## 2015-12-11 DIAGNOSIS — M8448XD Pathological fracture, other site, subsequent encounter for fracture with routine healing: Secondary | ICD-10-CM | POA: Diagnosis not present

## 2015-12-11 DIAGNOSIS — I10 Essential (primary) hypertension: Secondary | ICD-10-CM | POA: Diagnosis not present

## 2015-12-12 DIAGNOSIS — Z9484 Stem cells transplant status: Secondary | ICD-10-CM | POA: Diagnosis not present

## 2015-12-12 DIAGNOSIS — C9000 Multiple myeloma not having achieved remission: Secondary | ICD-10-CM | POA: Diagnosis not present

## 2015-12-12 DIAGNOSIS — M8448XD Pathological fracture, other site, subsequent encounter for fracture with routine healing: Secondary | ICD-10-CM | POA: Diagnosis not present

## 2015-12-12 DIAGNOSIS — Z9221 Personal history of antineoplastic chemotherapy: Secondary | ICD-10-CM | POA: Diagnosis not present

## 2015-12-12 DIAGNOSIS — I10 Essential (primary) hypertension: Secondary | ICD-10-CM | POA: Diagnosis not present

## 2015-12-12 DIAGNOSIS — M533 Sacrococcygeal disorders, not elsewhere classified: Secondary | ICD-10-CM | POA: Diagnosis not present

## 2015-12-12 DIAGNOSIS — M5136 Other intervertebral disc degeneration, lumbar region: Secondary | ICD-10-CM | POA: Diagnosis not present

## 2015-12-12 DIAGNOSIS — Z9181 History of falling: Secondary | ICD-10-CM | POA: Diagnosis not present

## 2015-12-12 DIAGNOSIS — D539 Nutritional anemia, unspecified: Secondary | ICD-10-CM | POA: Diagnosis not present

## 2015-12-12 DIAGNOSIS — M4806 Spinal stenosis, lumbar region: Secondary | ICD-10-CM | POA: Diagnosis not present

## 2015-12-12 DIAGNOSIS — E785 Hyperlipidemia, unspecified: Secondary | ICD-10-CM | POA: Diagnosis not present

## 2015-12-13 DIAGNOSIS — M8448XD Pathological fracture, other site, subsequent encounter for fracture with routine healing: Secondary | ICD-10-CM | POA: Diagnosis not present

## 2015-12-13 DIAGNOSIS — M5136 Other intervertebral disc degeneration, lumbar region: Secondary | ICD-10-CM | POA: Diagnosis not present

## 2015-12-13 DIAGNOSIS — Z9181 History of falling: Secondary | ICD-10-CM | POA: Diagnosis not present

## 2015-12-13 DIAGNOSIS — D539 Nutritional anemia, unspecified: Secondary | ICD-10-CM | POA: Diagnosis not present

## 2015-12-13 DIAGNOSIS — M533 Sacrococcygeal disorders, not elsewhere classified: Secondary | ICD-10-CM | POA: Diagnosis not present

## 2015-12-13 DIAGNOSIS — Z9484 Stem cells transplant status: Secondary | ICD-10-CM | POA: Diagnosis not present

## 2015-12-13 DIAGNOSIS — E785 Hyperlipidemia, unspecified: Secondary | ICD-10-CM | POA: Diagnosis not present

## 2015-12-13 DIAGNOSIS — I10 Essential (primary) hypertension: Secondary | ICD-10-CM | POA: Diagnosis not present

## 2015-12-13 DIAGNOSIS — Z9221 Personal history of antineoplastic chemotherapy: Secondary | ICD-10-CM | POA: Diagnosis not present

## 2015-12-13 DIAGNOSIS — M4806 Spinal stenosis, lumbar region: Secondary | ICD-10-CM | POA: Diagnosis not present

## 2015-12-13 DIAGNOSIS — C9000 Multiple myeloma not having achieved remission: Secondary | ICD-10-CM | POA: Diagnosis not present

## 2015-12-16 DIAGNOSIS — Z9181 History of falling: Secondary | ICD-10-CM | POA: Diagnosis not present

## 2015-12-16 DIAGNOSIS — M5136 Other intervertebral disc degeneration, lumbar region: Secondary | ICD-10-CM | POA: Diagnosis not present

## 2015-12-16 DIAGNOSIS — M8448XD Pathological fracture, other site, subsequent encounter for fracture with routine healing: Secondary | ICD-10-CM | POA: Diagnosis not present

## 2015-12-16 DIAGNOSIS — Z9221 Personal history of antineoplastic chemotherapy: Secondary | ICD-10-CM | POA: Diagnosis not present

## 2015-12-16 DIAGNOSIS — I10 Essential (primary) hypertension: Secondary | ICD-10-CM | POA: Diagnosis not present

## 2015-12-16 DIAGNOSIS — E785 Hyperlipidemia, unspecified: Secondary | ICD-10-CM | POA: Diagnosis not present

## 2015-12-16 DIAGNOSIS — C9000 Multiple myeloma not having achieved remission: Secondary | ICD-10-CM | POA: Diagnosis not present

## 2015-12-16 DIAGNOSIS — Z9484 Stem cells transplant status: Secondary | ICD-10-CM | POA: Diagnosis not present

## 2015-12-16 DIAGNOSIS — D539 Nutritional anemia, unspecified: Secondary | ICD-10-CM | POA: Diagnosis not present

## 2015-12-16 DIAGNOSIS — M533 Sacrococcygeal disorders, not elsewhere classified: Secondary | ICD-10-CM | POA: Diagnosis not present

## 2015-12-16 DIAGNOSIS — M4806 Spinal stenosis, lumbar region: Secondary | ICD-10-CM | POA: Diagnosis not present

## 2015-12-18 DIAGNOSIS — Z9481 Bone marrow transplant status: Secondary | ICD-10-CM | POA: Diagnosis not present

## 2015-12-18 DIAGNOSIS — Z9221 Personal history of antineoplastic chemotherapy: Secondary | ICD-10-CM | POA: Diagnosis not present

## 2015-12-18 DIAGNOSIS — Z9484 Stem cells transplant status: Secondary | ICD-10-CM | POA: Diagnosis not present

## 2015-12-18 DIAGNOSIS — C9000 Multiple myeloma not having achieved remission: Secondary | ICD-10-CM | POA: Diagnosis not present

## 2015-12-18 DIAGNOSIS — M4806 Spinal stenosis, lumbar region: Secondary | ICD-10-CM | POA: Diagnosis not present

## 2015-12-18 DIAGNOSIS — M8448XD Pathological fracture, other site, subsequent encounter for fracture with routine healing: Secondary | ICD-10-CM | POA: Diagnosis not present

## 2015-12-18 DIAGNOSIS — M5136 Other intervertebral disc degeneration, lumbar region: Secondary | ICD-10-CM | POA: Diagnosis not present

## 2015-12-18 DIAGNOSIS — I10 Essential (primary) hypertension: Secondary | ICD-10-CM | POA: Diagnosis not present

## 2015-12-18 DIAGNOSIS — Z9181 History of falling: Secondary | ICD-10-CM | POA: Diagnosis not present

## 2015-12-18 DIAGNOSIS — E785 Hyperlipidemia, unspecified: Secondary | ICD-10-CM | POA: Diagnosis not present

## 2015-12-18 DIAGNOSIS — D539 Nutritional anemia, unspecified: Secondary | ICD-10-CM | POA: Diagnosis not present

## 2015-12-18 DIAGNOSIS — M533 Sacrococcygeal disorders, not elsewhere classified: Secondary | ICD-10-CM | POA: Diagnosis not present

## 2015-12-19 DIAGNOSIS — D539 Nutritional anemia, unspecified: Secondary | ICD-10-CM | POA: Diagnosis not present

## 2015-12-19 DIAGNOSIS — I10 Essential (primary) hypertension: Secondary | ICD-10-CM | POA: Diagnosis not present

## 2015-12-19 DIAGNOSIS — M8448XD Pathological fracture, other site, subsequent encounter for fracture with routine healing: Secondary | ICD-10-CM | POA: Diagnosis not present

## 2015-12-19 DIAGNOSIS — E785 Hyperlipidemia, unspecified: Secondary | ICD-10-CM | POA: Diagnosis not present

## 2015-12-19 DIAGNOSIS — C9000 Multiple myeloma not having achieved remission: Secondary | ICD-10-CM | POA: Diagnosis not present

## 2015-12-19 DIAGNOSIS — M5136 Other intervertebral disc degeneration, lumbar region: Secondary | ICD-10-CM | POA: Diagnosis not present

## 2015-12-19 DIAGNOSIS — Z9181 History of falling: Secondary | ICD-10-CM | POA: Diagnosis not present

## 2015-12-19 DIAGNOSIS — M4806 Spinal stenosis, lumbar region: Secondary | ICD-10-CM | POA: Diagnosis not present

## 2015-12-19 DIAGNOSIS — Z9221 Personal history of antineoplastic chemotherapy: Secondary | ICD-10-CM | POA: Diagnosis not present

## 2015-12-19 DIAGNOSIS — M533 Sacrococcygeal disorders, not elsewhere classified: Secondary | ICD-10-CM | POA: Diagnosis not present

## 2015-12-19 DIAGNOSIS — Z9484 Stem cells transplant status: Secondary | ICD-10-CM | POA: Diagnosis not present

## 2015-12-23 DIAGNOSIS — Z9181 History of falling: Secondary | ICD-10-CM | POA: Diagnosis not present

## 2015-12-23 DIAGNOSIS — M4806 Spinal stenosis, lumbar region: Secondary | ICD-10-CM | POA: Diagnosis not present

## 2015-12-23 DIAGNOSIS — M533 Sacrococcygeal disorders, not elsewhere classified: Secondary | ICD-10-CM | POA: Diagnosis not present

## 2015-12-23 DIAGNOSIS — E785 Hyperlipidemia, unspecified: Secondary | ICD-10-CM | POA: Diagnosis not present

## 2015-12-23 DIAGNOSIS — Z9484 Stem cells transplant status: Secondary | ICD-10-CM | POA: Diagnosis not present

## 2015-12-23 DIAGNOSIS — D539 Nutritional anemia, unspecified: Secondary | ICD-10-CM | POA: Diagnosis not present

## 2015-12-23 DIAGNOSIS — Z9221 Personal history of antineoplastic chemotherapy: Secondary | ICD-10-CM | POA: Diagnosis not present

## 2015-12-23 DIAGNOSIS — C9000 Multiple myeloma not having achieved remission: Secondary | ICD-10-CM | POA: Diagnosis not present

## 2015-12-23 DIAGNOSIS — I10 Essential (primary) hypertension: Secondary | ICD-10-CM | POA: Diagnosis not present

## 2015-12-23 DIAGNOSIS — M5136 Other intervertebral disc degeneration, lumbar region: Secondary | ICD-10-CM | POA: Diagnosis not present

## 2015-12-23 DIAGNOSIS — M8448XD Pathological fracture, other site, subsequent encounter for fracture with routine healing: Secondary | ICD-10-CM | POA: Diagnosis not present

## 2015-12-24 DIAGNOSIS — C9000 Multiple myeloma not having achieved remission: Secondary | ICD-10-CM | POA: Diagnosis not present

## 2015-12-24 DIAGNOSIS — Z9221 Personal history of antineoplastic chemotherapy: Secondary | ICD-10-CM | POA: Diagnosis not present

## 2015-12-24 DIAGNOSIS — D539 Nutritional anemia, unspecified: Secondary | ICD-10-CM | POA: Diagnosis not present

## 2015-12-24 DIAGNOSIS — M533 Sacrococcygeal disorders, not elsewhere classified: Secondary | ICD-10-CM | POA: Diagnosis not present

## 2015-12-24 DIAGNOSIS — M4806 Spinal stenosis, lumbar region: Secondary | ICD-10-CM | POA: Diagnosis not present

## 2015-12-24 DIAGNOSIS — M8448XD Pathological fracture, other site, subsequent encounter for fracture with routine healing: Secondary | ICD-10-CM | POA: Diagnosis not present

## 2015-12-24 DIAGNOSIS — Z9484 Stem cells transplant status: Secondary | ICD-10-CM | POA: Diagnosis not present

## 2015-12-24 DIAGNOSIS — E785 Hyperlipidemia, unspecified: Secondary | ICD-10-CM | POA: Diagnosis not present

## 2015-12-24 DIAGNOSIS — Z9181 History of falling: Secondary | ICD-10-CM | POA: Diagnosis not present

## 2015-12-24 DIAGNOSIS — M5136 Other intervertebral disc degeneration, lumbar region: Secondary | ICD-10-CM | POA: Diagnosis not present

## 2015-12-24 DIAGNOSIS — I10 Essential (primary) hypertension: Secondary | ICD-10-CM | POA: Diagnosis not present

## 2015-12-25 DIAGNOSIS — Z9484 Stem cells transplant status: Secondary | ICD-10-CM | POA: Diagnosis not present

## 2015-12-25 DIAGNOSIS — M5136 Other intervertebral disc degeneration, lumbar region: Secondary | ICD-10-CM | POA: Diagnosis not present

## 2015-12-25 DIAGNOSIS — M8448XD Pathological fracture, other site, subsequent encounter for fracture with routine healing: Secondary | ICD-10-CM | POA: Diagnosis not present

## 2015-12-25 DIAGNOSIS — D539 Nutritional anemia, unspecified: Secondary | ICD-10-CM | POA: Diagnosis not present

## 2015-12-25 DIAGNOSIS — Z9221 Personal history of antineoplastic chemotherapy: Secondary | ICD-10-CM | POA: Diagnosis not present

## 2015-12-25 DIAGNOSIS — Z9181 History of falling: Secondary | ICD-10-CM | POA: Diagnosis not present

## 2015-12-25 DIAGNOSIS — M533 Sacrococcygeal disorders, not elsewhere classified: Secondary | ICD-10-CM | POA: Diagnosis not present

## 2015-12-25 DIAGNOSIS — E785 Hyperlipidemia, unspecified: Secondary | ICD-10-CM | POA: Diagnosis not present

## 2015-12-25 DIAGNOSIS — M4806 Spinal stenosis, lumbar region: Secondary | ICD-10-CM | POA: Diagnosis not present

## 2015-12-25 DIAGNOSIS — C9000 Multiple myeloma not having achieved remission: Secondary | ICD-10-CM | POA: Diagnosis not present

## 2015-12-25 DIAGNOSIS — I10 Essential (primary) hypertension: Secondary | ICD-10-CM | POA: Diagnosis not present

## 2015-12-26 DIAGNOSIS — D539 Nutritional anemia, unspecified: Secondary | ICD-10-CM | POA: Diagnosis not present

## 2015-12-26 DIAGNOSIS — E785 Hyperlipidemia, unspecified: Secondary | ICD-10-CM | POA: Diagnosis not present

## 2015-12-26 DIAGNOSIS — C9000 Multiple myeloma not having achieved remission: Secondary | ICD-10-CM | POA: Diagnosis not present

## 2015-12-26 DIAGNOSIS — M5136 Other intervertebral disc degeneration, lumbar region: Secondary | ICD-10-CM | POA: Diagnosis not present

## 2015-12-26 DIAGNOSIS — Z9484 Stem cells transplant status: Secondary | ICD-10-CM | POA: Diagnosis not present

## 2015-12-26 DIAGNOSIS — Z9221 Personal history of antineoplastic chemotherapy: Secondary | ICD-10-CM | POA: Diagnosis not present

## 2015-12-26 DIAGNOSIS — I10 Essential (primary) hypertension: Secondary | ICD-10-CM | POA: Diagnosis not present

## 2015-12-26 DIAGNOSIS — Z9181 History of falling: Secondary | ICD-10-CM | POA: Diagnosis not present

## 2015-12-26 DIAGNOSIS — M533 Sacrococcygeal disorders, not elsewhere classified: Secondary | ICD-10-CM | POA: Diagnosis not present

## 2015-12-26 DIAGNOSIS — M4806 Spinal stenosis, lumbar region: Secondary | ICD-10-CM | POA: Diagnosis not present

## 2015-12-26 DIAGNOSIS — M8448XD Pathological fracture, other site, subsequent encounter for fracture with routine healing: Secondary | ICD-10-CM | POA: Diagnosis not present

## 2015-12-27 DIAGNOSIS — D539 Nutritional anemia, unspecified: Secondary | ICD-10-CM | POA: Diagnosis not present

## 2015-12-27 DIAGNOSIS — M8448XD Pathological fracture, other site, subsequent encounter for fracture with routine healing: Secondary | ICD-10-CM | POA: Diagnosis not present

## 2015-12-27 DIAGNOSIS — Z9484 Stem cells transplant status: Secondary | ICD-10-CM | POA: Diagnosis not present

## 2015-12-27 DIAGNOSIS — E785 Hyperlipidemia, unspecified: Secondary | ICD-10-CM | POA: Diagnosis not present

## 2015-12-27 DIAGNOSIS — Z9221 Personal history of antineoplastic chemotherapy: Secondary | ICD-10-CM | POA: Diagnosis not present

## 2015-12-27 DIAGNOSIS — C9000 Multiple myeloma not having achieved remission: Secondary | ICD-10-CM | POA: Diagnosis not present

## 2015-12-27 DIAGNOSIS — M4806 Spinal stenosis, lumbar region: Secondary | ICD-10-CM | POA: Diagnosis not present

## 2015-12-27 DIAGNOSIS — M533 Sacrococcygeal disorders, not elsewhere classified: Secondary | ICD-10-CM | POA: Diagnosis not present

## 2015-12-27 DIAGNOSIS — M5136 Other intervertebral disc degeneration, lumbar region: Secondary | ICD-10-CM | POA: Diagnosis not present

## 2015-12-27 DIAGNOSIS — I10 Essential (primary) hypertension: Secondary | ICD-10-CM | POA: Diagnosis not present

## 2015-12-27 DIAGNOSIS — Z9181 History of falling: Secondary | ICD-10-CM | POA: Diagnosis not present

## 2015-12-29 DIAGNOSIS — C9000 Multiple myeloma not having achieved remission: Secondary | ICD-10-CM | POA: Diagnosis not present

## 2015-12-30 ENCOUNTER — Telehealth: Payer: Self-pay

## 2015-12-30 DIAGNOSIS — M4806 Spinal stenosis, lumbar region: Secondary | ICD-10-CM | POA: Diagnosis not present

## 2015-12-30 DIAGNOSIS — M5136 Other intervertebral disc degeneration, lumbar region: Secondary | ICD-10-CM | POA: Diagnosis not present

## 2015-12-30 DIAGNOSIS — Z9484 Stem cells transplant status: Secondary | ICD-10-CM | POA: Diagnosis not present

## 2015-12-30 DIAGNOSIS — C9000 Multiple myeloma not having achieved remission: Secondary | ICD-10-CM | POA: Diagnosis not present

## 2015-12-30 DIAGNOSIS — I10 Essential (primary) hypertension: Secondary | ICD-10-CM | POA: Diagnosis not present

## 2015-12-30 DIAGNOSIS — E785 Hyperlipidemia, unspecified: Secondary | ICD-10-CM | POA: Diagnosis not present

## 2015-12-30 DIAGNOSIS — Z9221 Personal history of antineoplastic chemotherapy: Secondary | ICD-10-CM | POA: Diagnosis not present

## 2015-12-30 DIAGNOSIS — D539 Nutritional anemia, unspecified: Secondary | ICD-10-CM | POA: Diagnosis not present

## 2015-12-30 DIAGNOSIS — M533 Sacrococcygeal disorders, not elsewhere classified: Secondary | ICD-10-CM | POA: Diagnosis not present

## 2015-12-30 DIAGNOSIS — M8448XD Pathological fracture, other site, subsequent encounter for fracture with routine healing: Secondary | ICD-10-CM | POA: Diagnosis not present

## 2015-12-30 DIAGNOSIS — Z9181 History of falling: Secondary | ICD-10-CM | POA: Diagnosis not present

## 2015-12-30 NOTE — Telephone Encounter (Signed)
Joelene Millin with [facility] is requesting extended PT for the patient. She would like to continue PT (3X a week for 3 weeks). Please call Joelene Millin at [phone].  ok

## 2015-12-30 NOTE — Telephone Encounter (Signed)
Joelene Millin with [facility] is requesting extended PT for the patient.  She would like to continue PT (3X a week for 3 weeks).  Please call Joelene Millin at [phone].

## 2015-12-30 NOTE — Telephone Encounter (Signed)
Returned Kimberly's call, no answer, did leave VM with verbal ok for continuation of PT

## 2015-12-31 ENCOUNTER — Telehealth: Payer: Self-pay | Admitting: Family Medicine

## 2015-12-31 DIAGNOSIS — M533 Sacrococcygeal disorders, not elsewhere classified: Secondary | ICD-10-CM | POA: Diagnosis not present

## 2015-12-31 DIAGNOSIS — M4806 Spinal stenosis, lumbar region: Secondary | ICD-10-CM | POA: Diagnosis not present

## 2015-12-31 DIAGNOSIS — D539 Nutritional anemia, unspecified: Secondary | ICD-10-CM | POA: Diagnosis not present

## 2015-12-31 DIAGNOSIS — E785 Hyperlipidemia, unspecified: Secondary | ICD-10-CM | POA: Diagnosis not present

## 2015-12-31 DIAGNOSIS — I10 Essential (primary) hypertension: Secondary | ICD-10-CM | POA: Diagnosis not present

## 2015-12-31 DIAGNOSIS — M8448XD Pathological fracture, other site, subsequent encounter for fracture with routine healing: Secondary | ICD-10-CM | POA: Diagnosis not present

## 2015-12-31 DIAGNOSIS — M5136 Other intervertebral disc degeneration, lumbar region: Secondary | ICD-10-CM | POA: Diagnosis not present

## 2015-12-31 DIAGNOSIS — Z9181 History of falling: Secondary | ICD-10-CM | POA: Diagnosis not present

## 2015-12-31 DIAGNOSIS — Z9221 Personal history of antineoplastic chemotherapy: Secondary | ICD-10-CM | POA: Diagnosis not present

## 2015-12-31 DIAGNOSIS — Z9484 Stem cells transplant status: Secondary | ICD-10-CM | POA: Diagnosis not present

## 2015-12-31 DIAGNOSIS — C9000 Multiple myeloma not having achieved remission: Secondary | ICD-10-CM | POA: Diagnosis not present

## 2015-12-31 NOTE — Telephone Encounter (Signed)
Rodney Newton would like to request orders to continue occupational therapy 2 times a week for 3 weeks.   ok

## 2015-12-31 NOTE — Telephone Encounter (Signed)
Verbal order called in.

## 2015-12-31 NOTE — Telephone Encounter (Signed)
Rodney Newton would like to request orders to continue occupational therapy 2 times a week for 3 weeks.

## 2016-01-01 DIAGNOSIS — M4806 Spinal stenosis, lumbar region: Secondary | ICD-10-CM | POA: Diagnosis not present

## 2016-01-01 DIAGNOSIS — Z9484 Stem cells transplant status: Secondary | ICD-10-CM | POA: Diagnosis not present

## 2016-01-01 DIAGNOSIS — M533 Sacrococcygeal disorders, not elsewhere classified: Secondary | ICD-10-CM | POA: Diagnosis not present

## 2016-01-01 DIAGNOSIS — C9000 Multiple myeloma not having achieved remission: Secondary | ICD-10-CM | POA: Diagnosis not present

## 2016-01-01 DIAGNOSIS — Z9181 History of falling: Secondary | ICD-10-CM | POA: Diagnosis not present

## 2016-01-01 DIAGNOSIS — M5136 Other intervertebral disc degeneration, lumbar region: Secondary | ICD-10-CM | POA: Diagnosis not present

## 2016-01-01 DIAGNOSIS — E785 Hyperlipidemia, unspecified: Secondary | ICD-10-CM | POA: Diagnosis not present

## 2016-01-01 DIAGNOSIS — D539 Nutritional anemia, unspecified: Secondary | ICD-10-CM | POA: Diagnosis not present

## 2016-01-01 DIAGNOSIS — I10 Essential (primary) hypertension: Secondary | ICD-10-CM | POA: Diagnosis not present

## 2016-01-01 DIAGNOSIS — M8448XD Pathological fracture, other site, subsequent encounter for fracture with routine healing: Secondary | ICD-10-CM | POA: Diagnosis not present

## 2016-01-01 DIAGNOSIS — Z9221 Personal history of antineoplastic chemotherapy: Secondary | ICD-10-CM | POA: Diagnosis not present

## 2016-01-02 ENCOUNTER — Ambulatory Visit: Payer: Medicare Other | Admitting: Family Medicine

## 2016-01-02 DIAGNOSIS — C9000 Multiple myeloma not having achieved remission: Secondary | ICD-10-CM | POA: Diagnosis not present

## 2016-01-02 DIAGNOSIS — M533 Sacrococcygeal disorders, not elsewhere classified: Secondary | ICD-10-CM | POA: Diagnosis not present

## 2016-01-02 DIAGNOSIS — E785 Hyperlipidemia, unspecified: Secondary | ICD-10-CM | POA: Diagnosis not present

## 2016-01-02 DIAGNOSIS — Z9221 Personal history of antineoplastic chemotherapy: Secondary | ICD-10-CM | POA: Diagnosis not present

## 2016-01-02 DIAGNOSIS — M8448XD Pathological fracture, other site, subsequent encounter for fracture with routine healing: Secondary | ICD-10-CM | POA: Diagnosis not present

## 2016-01-02 DIAGNOSIS — D539 Nutritional anemia, unspecified: Secondary | ICD-10-CM | POA: Diagnosis not present

## 2016-01-02 DIAGNOSIS — I10 Essential (primary) hypertension: Secondary | ICD-10-CM | POA: Diagnosis not present

## 2016-01-02 DIAGNOSIS — M5136 Other intervertebral disc degeneration, lumbar region: Secondary | ICD-10-CM | POA: Diagnosis not present

## 2016-01-02 DIAGNOSIS — Z9484 Stem cells transplant status: Secondary | ICD-10-CM | POA: Diagnosis not present

## 2016-01-02 DIAGNOSIS — Z9181 History of falling: Secondary | ICD-10-CM | POA: Diagnosis not present

## 2016-01-02 DIAGNOSIS — M4806 Spinal stenosis, lumbar region: Secondary | ICD-10-CM | POA: Diagnosis not present

## 2016-01-03 DIAGNOSIS — Z9221 Personal history of antineoplastic chemotherapy: Secondary | ICD-10-CM | POA: Diagnosis not present

## 2016-01-03 DIAGNOSIS — M5136 Other intervertebral disc degeneration, lumbar region: Secondary | ICD-10-CM | POA: Diagnosis not present

## 2016-01-03 DIAGNOSIS — M4806 Spinal stenosis, lumbar region: Secondary | ICD-10-CM | POA: Diagnosis not present

## 2016-01-03 DIAGNOSIS — Z9484 Stem cells transplant status: Secondary | ICD-10-CM | POA: Diagnosis not present

## 2016-01-03 DIAGNOSIS — Z9181 History of falling: Secondary | ICD-10-CM | POA: Diagnosis not present

## 2016-01-03 DIAGNOSIS — M533 Sacrococcygeal disorders, not elsewhere classified: Secondary | ICD-10-CM | POA: Diagnosis not present

## 2016-01-03 DIAGNOSIS — D539 Nutritional anemia, unspecified: Secondary | ICD-10-CM | POA: Diagnosis not present

## 2016-01-03 DIAGNOSIS — E785 Hyperlipidemia, unspecified: Secondary | ICD-10-CM | POA: Diagnosis not present

## 2016-01-03 DIAGNOSIS — M8448XD Pathological fracture, other site, subsequent encounter for fracture with routine healing: Secondary | ICD-10-CM | POA: Diagnosis not present

## 2016-01-03 DIAGNOSIS — I10 Essential (primary) hypertension: Secondary | ICD-10-CM | POA: Diagnosis not present

## 2016-01-03 DIAGNOSIS — C9000 Multiple myeloma not having achieved remission: Secondary | ICD-10-CM | POA: Diagnosis not present

## 2016-01-06 DIAGNOSIS — Z9484 Stem cells transplant status: Secondary | ICD-10-CM | POA: Diagnosis not present

## 2016-01-06 DIAGNOSIS — M4806 Spinal stenosis, lumbar region: Secondary | ICD-10-CM | POA: Diagnosis not present

## 2016-01-06 DIAGNOSIS — M8448XD Pathological fracture, other site, subsequent encounter for fracture with routine healing: Secondary | ICD-10-CM | POA: Diagnosis not present

## 2016-01-06 DIAGNOSIS — M5136 Other intervertebral disc degeneration, lumbar region: Secondary | ICD-10-CM | POA: Diagnosis not present

## 2016-01-06 DIAGNOSIS — I10 Essential (primary) hypertension: Secondary | ICD-10-CM | POA: Diagnosis not present

## 2016-01-06 DIAGNOSIS — D539 Nutritional anemia, unspecified: Secondary | ICD-10-CM | POA: Diagnosis not present

## 2016-01-06 DIAGNOSIS — M533 Sacrococcygeal disorders, not elsewhere classified: Secondary | ICD-10-CM | POA: Diagnosis not present

## 2016-01-06 DIAGNOSIS — Z9221 Personal history of antineoplastic chemotherapy: Secondary | ICD-10-CM | POA: Diagnosis not present

## 2016-01-06 DIAGNOSIS — Z9181 History of falling: Secondary | ICD-10-CM | POA: Diagnosis not present

## 2016-01-06 DIAGNOSIS — E785 Hyperlipidemia, unspecified: Secondary | ICD-10-CM | POA: Diagnosis not present

## 2016-01-06 DIAGNOSIS — C9000 Multiple myeloma not having achieved remission: Secondary | ICD-10-CM | POA: Diagnosis not present

## 2016-01-07 DIAGNOSIS — Z9181 History of falling: Secondary | ICD-10-CM | POA: Diagnosis not present

## 2016-01-07 DIAGNOSIS — E785 Hyperlipidemia, unspecified: Secondary | ICD-10-CM | POA: Diagnosis not present

## 2016-01-07 DIAGNOSIS — M4806 Spinal stenosis, lumbar region: Secondary | ICD-10-CM | POA: Diagnosis not present

## 2016-01-07 DIAGNOSIS — C9000 Multiple myeloma not having achieved remission: Secondary | ICD-10-CM | POA: Diagnosis not present

## 2016-01-07 DIAGNOSIS — I10 Essential (primary) hypertension: Secondary | ICD-10-CM | POA: Diagnosis not present

## 2016-01-07 DIAGNOSIS — M5136 Other intervertebral disc degeneration, lumbar region: Secondary | ICD-10-CM | POA: Diagnosis not present

## 2016-01-07 DIAGNOSIS — Z9221 Personal history of antineoplastic chemotherapy: Secondary | ICD-10-CM | POA: Diagnosis not present

## 2016-01-07 DIAGNOSIS — M533 Sacrococcygeal disorders, not elsewhere classified: Secondary | ICD-10-CM | POA: Diagnosis not present

## 2016-01-07 DIAGNOSIS — D539 Nutritional anemia, unspecified: Secondary | ICD-10-CM | POA: Diagnosis not present

## 2016-01-07 DIAGNOSIS — Z9484 Stem cells transplant status: Secondary | ICD-10-CM | POA: Diagnosis not present

## 2016-01-07 DIAGNOSIS — M8448XD Pathological fracture, other site, subsequent encounter for fracture with routine healing: Secondary | ICD-10-CM | POA: Diagnosis not present

## 2016-01-08 DIAGNOSIS — D539 Nutritional anemia, unspecified: Secondary | ICD-10-CM | POA: Diagnosis not present

## 2016-01-08 DIAGNOSIS — E785 Hyperlipidemia, unspecified: Secondary | ICD-10-CM | POA: Diagnosis not present

## 2016-01-08 DIAGNOSIS — Z9484 Stem cells transplant status: Secondary | ICD-10-CM | POA: Diagnosis not present

## 2016-01-08 DIAGNOSIS — C9000 Multiple myeloma not having achieved remission: Secondary | ICD-10-CM | POA: Diagnosis not present

## 2016-01-08 DIAGNOSIS — Z9221 Personal history of antineoplastic chemotherapy: Secondary | ICD-10-CM | POA: Diagnosis not present

## 2016-01-08 DIAGNOSIS — M533 Sacrococcygeal disorders, not elsewhere classified: Secondary | ICD-10-CM | POA: Diagnosis not present

## 2016-01-08 DIAGNOSIS — Z9181 History of falling: Secondary | ICD-10-CM | POA: Diagnosis not present

## 2016-01-08 DIAGNOSIS — M5136 Other intervertebral disc degeneration, lumbar region: Secondary | ICD-10-CM | POA: Diagnosis not present

## 2016-01-08 DIAGNOSIS — I10 Essential (primary) hypertension: Secondary | ICD-10-CM | POA: Diagnosis not present

## 2016-01-08 DIAGNOSIS — M4806 Spinal stenosis, lumbar region: Secondary | ICD-10-CM | POA: Diagnosis not present

## 2016-01-08 DIAGNOSIS — M8448XD Pathological fracture, other site, subsequent encounter for fracture with routine healing: Secondary | ICD-10-CM | POA: Diagnosis not present

## 2016-01-09 DIAGNOSIS — Z9181 History of falling: Secondary | ICD-10-CM | POA: Diagnosis not present

## 2016-01-09 DIAGNOSIS — D539 Nutritional anemia, unspecified: Secondary | ICD-10-CM | POA: Diagnosis not present

## 2016-01-09 DIAGNOSIS — C9000 Multiple myeloma not having achieved remission: Secondary | ICD-10-CM | POA: Diagnosis not present

## 2016-01-09 DIAGNOSIS — M5136 Other intervertebral disc degeneration, lumbar region: Secondary | ICD-10-CM | POA: Diagnosis not present

## 2016-01-09 DIAGNOSIS — I10 Essential (primary) hypertension: Secondary | ICD-10-CM | POA: Diagnosis not present

## 2016-01-09 DIAGNOSIS — M8448XD Pathological fracture, other site, subsequent encounter for fracture with routine healing: Secondary | ICD-10-CM | POA: Diagnosis not present

## 2016-01-09 DIAGNOSIS — Z9484 Stem cells transplant status: Secondary | ICD-10-CM | POA: Diagnosis not present

## 2016-01-09 DIAGNOSIS — M4806 Spinal stenosis, lumbar region: Secondary | ICD-10-CM | POA: Diagnosis not present

## 2016-01-09 DIAGNOSIS — E785 Hyperlipidemia, unspecified: Secondary | ICD-10-CM | POA: Diagnosis not present

## 2016-01-09 DIAGNOSIS — M533 Sacrococcygeal disorders, not elsewhere classified: Secondary | ICD-10-CM | POA: Diagnosis not present

## 2016-01-09 DIAGNOSIS — Z9221 Personal history of antineoplastic chemotherapy: Secondary | ICD-10-CM | POA: Diagnosis not present

## 2016-01-13 DIAGNOSIS — D539 Nutritional anemia, unspecified: Secondary | ICD-10-CM | POA: Diagnosis not present

## 2016-01-13 DIAGNOSIS — Z9484 Stem cells transplant status: Secondary | ICD-10-CM | POA: Diagnosis not present

## 2016-01-13 DIAGNOSIS — Z9181 History of falling: Secondary | ICD-10-CM | POA: Diagnosis not present

## 2016-01-13 DIAGNOSIS — M4806 Spinal stenosis, lumbar region: Secondary | ICD-10-CM | POA: Diagnosis not present

## 2016-01-13 DIAGNOSIS — E785 Hyperlipidemia, unspecified: Secondary | ICD-10-CM | POA: Diagnosis not present

## 2016-01-13 DIAGNOSIS — M5136 Other intervertebral disc degeneration, lumbar region: Secondary | ICD-10-CM | POA: Diagnosis not present

## 2016-01-13 DIAGNOSIS — M533 Sacrococcygeal disorders, not elsewhere classified: Secondary | ICD-10-CM | POA: Diagnosis not present

## 2016-01-13 DIAGNOSIS — I10 Essential (primary) hypertension: Secondary | ICD-10-CM | POA: Diagnosis not present

## 2016-01-13 DIAGNOSIS — M8448XD Pathological fracture, other site, subsequent encounter for fracture with routine healing: Secondary | ICD-10-CM | POA: Diagnosis not present

## 2016-01-13 DIAGNOSIS — C9000 Multiple myeloma not having achieved remission: Secondary | ICD-10-CM | POA: Diagnosis not present

## 2016-01-13 DIAGNOSIS — Z9221 Personal history of antineoplastic chemotherapy: Secondary | ICD-10-CM | POA: Diagnosis not present

## 2016-01-15 DIAGNOSIS — M533 Sacrococcygeal disorders, not elsewhere classified: Secondary | ICD-10-CM | POA: Diagnosis not present

## 2016-01-15 DIAGNOSIS — Z9181 History of falling: Secondary | ICD-10-CM | POA: Diagnosis not present

## 2016-01-15 DIAGNOSIS — D539 Nutritional anemia, unspecified: Secondary | ICD-10-CM | POA: Diagnosis not present

## 2016-01-15 DIAGNOSIS — Z9484 Stem cells transplant status: Secondary | ICD-10-CM | POA: Diagnosis not present

## 2016-01-15 DIAGNOSIS — M4806 Spinal stenosis, lumbar region: Secondary | ICD-10-CM | POA: Diagnosis not present

## 2016-01-15 DIAGNOSIS — E785 Hyperlipidemia, unspecified: Secondary | ICD-10-CM | POA: Diagnosis not present

## 2016-01-15 DIAGNOSIS — M5136 Other intervertebral disc degeneration, lumbar region: Secondary | ICD-10-CM | POA: Diagnosis not present

## 2016-01-15 DIAGNOSIS — Z9221 Personal history of antineoplastic chemotherapy: Secondary | ICD-10-CM | POA: Diagnosis not present

## 2016-01-15 DIAGNOSIS — C9000 Multiple myeloma not having achieved remission: Secondary | ICD-10-CM | POA: Diagnosis not present

## 2016-01-15 DIAGNOSIS — M8448XD Pathological fracture, other site, subsequent encounter for fracture with routine healing: Secondary | ICD-10-CM | POA: Diagnosis not present

## 2016-01-15 DIAGNOSIS — I10 Essential (primary) hypertension: Secondary | ICD-10-CM | POA: Diagnosis not present

## 2016-01-17 DIAGNOSIS — E785 Hyperlipidemia, unspecified: Secondary | ICD-10-CM | POA: Diagnosis not present

## 2016-01-17 DIAGNOSIS — Z9221 Personal history of antineoplastic chemotherapy: Secondary | ICD-10-CM | POA: Diagnosis not present

## 2016-01-17 DIAGNOSIS — D539 Nutritional anemia, unspecified: Secondary | ICD-10-CM | POA: Diagnosis not present

## 2016-01-17 DIAGNOSIS — I10 Essential (primary) hypertension: Secondary | ICD-10-CM | POA: Diagnosis not present

## 2016-01-17 DIAGNOSIS — M5136 Other intervertebral disc degeneration, lumbar region: Secondary | ICD-10-CM | POA: Diagnosis not present

## 2016-01-17 DIAGNOSIS — Z9484 Stem cells transplant status: Secondary | ICD-10-CM | POA: Diagnosis not present

## 2016-01-17 DIAGNOSIS — Z9181 History of falling: Secondary | ICD-10-CM | POA: Diagnosis not present

## 2016-01-17 DIAGNOSIS — M4806 Spinal stenosis, lumbar region: Secondary | ICD-10-CM | POA: Diagnosis not present

## 2016-01-17 DIAGNOSIS — C9000 Multiple myeloma not having achieved remission: Secondary | ICD-10-CM | POA: Diagnosis not present

## 2016-01-17 DIAGNOSIS — M8448XD Pathological fracture, other site, subsequent encounter for fracture with routine healing: Secondary | ICD-10-CM | POA: Diagnosis not present

## 2016-01-17 DIAGNOSIS — M533 Sacrococcygeal disorders, not elsewhere classified: Secondary | ICD-10-CM | POA: Diagnosis not present

## 2016-01-22 DIAGNOSIS — M5136 Other intervertebral disc degeneration, lumbar region: Secondary | ICD-10-CM | POA: Diagnosis not present

## 2016-01-22 DIAGNOSIS — M4807 Spinal stenosis, lumbosacral region: Secondary | ICD-10-CM | POA: Diagnosis not present

## 2016-01-22 DIAGNOSIS — Z9481 Bone marrow transplant status: Secondary | ICD-10-CM | POA: Diagnosis not present

## 2016-01-22 DIAGNOSIS — C9000 Multiple myeloma not having achieved remission: Secondary | ICD-10-CM | POA: Diagnosis not present

## 2016-01-22 DIAGNOSIS — C9001 Multiple myeloma in remission: Secondary | ICD-10-CM | POA: Diagnosis not present

## 2016-01-28 DIAGNOSIS — C9000 Multiple myeloma not having achieved remission: Secondary | ICD-10-CM | POA: Diagnosis not present

## 2016-02-03 DIAGNOSIS — M6281 Muscle weakness (generalized): Secondary | ICD-10-CM | POA: Diagnosis not present

## 2016-02-03 DIAGNOSIS — R29898 Other symptoms and signs involving the musculoskeletal system: Secondary | ICD-10-CM | POA: Diagnosis not present

## 2016-02-03 DIAGNOSIS — G629 Polyneuropathy, unspecified: Secondary | ICD-10-CM | POA: Diagnosis not present

## 2016-02-03 DIAGNOSIS — R5381 Other malaise: Secondary | ICD-10-CM | POA: Diagnosis not present

## 2016-02-05 DIAGNOSIS — R29898 Other symptoms and signs involving the musculoskeletal system: Secondary | ICD-10-CM | POA: Diagnosis not present

## 2016-02-05 DIAGNOSIS — D539 Nutritional anemia, unspecified: Secondary | ICD-10-CM | POA: Diagnosis not present

## 2016-02-05 DIAGNOSIS — E785 Hyperlipidemia, unspecified: Secondary | ICD-10-CM | POA: Diagnosis not present

## 2016-02-05 DIAGNOSIS — I10 Essential (primary) hypertension: Secondary | ICD-10-CM | POA: Diagnosis not present

## 2016-02-05 DIAGNOSIS — M4806 Spinal stenosis, lumbar region: Secondary | ICD-10-CM | POA: Diagnosis not present

## 2016-02-05 DIAGNOSIS — M4316 Spondylolisthesis, lumbar region: Secondary | ICD-10-CM | POA: Diagnosis not present

## 2016-02-05 DIAGNOSIS — C9000 Multiple myeloma not having achieved remission: Secondary | ICD-10-CM | POA: Diagnosis not present

## 2016-02-05 DIAGNOSIS — M79605 Pain in left leg: Secondary | ICD-10-CM | POA: Diagnosis not present

## 2016-02-05 DIAGNOSIS — Z9481 Bone marrow transplant status: Secondary | ICD-10-CM | POA: Diagnosis not present

## 2016-02-05 DIAGNOSIS — C9001 Multiple myeloma in remission: Secondary | ICD-10-CM | POA: Diagnosis not present

## 2016-02-05 DIAGNOSIS — M79604 Pain in right leg: Secondary | ICD-10-CM | POA: Diagnosis not present

## 2016-02-05 DIAGNOSIS — G8929 Other chronic pain: Secondary | ICD-10-CM | POA: Diagnosis not present

## 2016-02-06 DIAGNOSIS — R29898 Other symptoms and signs involving the musculoskeletal system: Secondary | ICD-10-CM | POA: Diagnosis not present

## 2016-02-06 DIAGNOSIS — R5381 Other malaise: Secondary | ICD-10-CM | POA: Diagnosis not present

## 2016-02-06 DIAGNOSIS — M6281 Muscle weakness (generalized): Secondary | ICD-10-CM | POA: Diagnosis not present

## 2016-02-06 DIAGNOSIS — G629 Polyneuropathy, unspecified: Secondary | ICD-10-CM | POA: Diagnosis not present

## 2016-02-18 DIAGNOSIS — M6281 Muscle weakness (generalized): Secondary | ICD-10-CM | POA: Diagnosis not present

## 2016-02-18 DIAGNOSIS — R5381 Other malaise: Secondary | ICD-10-CM | POA: Diagnosis not present

## 2016-02-18 DIAGNOSIS — G629 Polyneuropathy, unspecified: Secondary | ICD-10-CM | POA: Diagnosis not present

## 2016-02-18 DIAGNOSIS — R29898 Other symptoms and signs involving the musculoskeletal system: Secondary | ICD-10-CM | POA: Diagnosis not present

## 2016-02-20 DIAGNOSIS — G629 Polyneuropathy, unspecified: Secondary | ICD-10-CM | POA: Diagnosis not present

## 2016-02-20 DIAGNOSIS — R29898 Other symptoms and signs involving the musculoskeletal system: Secondary | ICD-10-CM | POA: Diagnosis not present

## 2016-02-20 DIAGNOSIS — R5381 Other malaise: Secondary | ICD-10-CM | POA: Diagnosis not present

## 2016-02-20 DIAGNOSIS — M6281 Muscle weakness (generalized): Secondary | ICD-10-CM | POA: Diagnosis not present

## 2016-02-26 DIAGNOSIS — R5381 Other malaise: Secondary | ICD-10-CM | POA: Diagnosis not present

## 2016-02-26 DIAGNOSIS — R29898 Other symptoms and signs involving the musculoskeletal system: Secondary | ICD-10-CM | POA: Diagnosis not present

## 2016-02-26 DIAGNOSIS — G629 Polyneuropathy, unspecified: Secondary | ICD-10-CM | POA: Diagnosis not present

## 2016-02-26 DIAGNOSIS — M6281 Muscle weakness (generalized): Secondary | ICD-10-CM | POA: Diagnosis not present

## 2016-02-28 DIAGNOSIS — C9000 Multiple myeloma not having achieved remission: Secondary | ICD-10-CM | POA: Diagnosis not present

## 2016-02-28 DIAGNOSIS — R5381 Other malaise: Secondary | ICD-10-CM | POA: Diagnosis not present

## 2016-02-28 DIAGNOSIS — M6281 Muscle weakness (generalized): Secondary | ICD-10-CM | POA: Diagnosis not present

## 2016-02-28 DIAGNOSIS — G629 Polyneuropathy, unspecified: Secondary | ICD-10-CM | POA: Diagnosis not present

## 2016-02-28 DIAGNOSIS — R29898 Other symptoms and signs involving the musculoskeletal system: Secondary | ICD-10-CM | POA: Diagnosis not present

## 2016-03-03 DIAGNOSIS — M6281 Muscle weakness (generalized): Secondary | ICD-10-CM | POA: Diagnosis not present

## 2016-03-03 DIAGNOSIS — R29898 Other symptoms and signs involving the musculoskeletal system: Secondary | ICD-10-CM | POA: Diagnosis not present

## 2016-03-03 DIAGNOSIS — G629 Polyneuropathy, unspecified: Secondary | ICD-10-CM | POA: Diagnosis not present

## 2016-03-03 DIAGNOSIS — R5381 Other malaise: Secondary | ICD-10-CM | POA: Diagnosis not present

## 2016-03-05 DIAGNOSIS — R5381 Other malaise: Secondary | ICD-10-CM | POA: Diagnosis not present

## 2016-03-05 DIAGNOSIS — R29898 Other symptoms and signs involving the musculoskeletal system: Secondary | ICD-10-CM | POA: Diagnosis not present

## 2016-03-05 DIAGNOSIS — M6281 Muscle weakness (generalized): Secondary | ICD-10-CM | POA: Diagnosis not present

## 2016-03-05 DIAGNOSIS — G629 Polyneuropathy, unspecified: Secondary | ICD-10-CM | POA: Diagnosis not present

## 2016-03-09 DIAGNOSIS — E785 Hyperlipidemia, unspecified: Secondary | ICD-10-CM | POA: Diagnosis not present

## 2016-03-09 DIAGNOSIS — I10 Essential (primary) hypertension: Secondary | ICD-10-CM | POA: Diagnosis not present

## 2016-03-09 DIAGNOSIS — M4806 Spinal stenosis, lumbar region: Secondary | ICD-10-CM | POA: Diagnosis not present

## 2016-03-09 DIAGNOSIS — C9001 Multiple myeloma in remission: Secondary | ICD-10-CM | POA: Diagnosis not present

## 2016-03-09 DIAGNOSIS — C9000 Multiple myeloma not having achieved remission: Secondary | ICD-10-CM | POA: Diagnosis not present

## 2016-03-09 DIAGNOSIS — E875 Hyperkalemia: Secondary | ICD-10-CM | POA: Diagnosis not present

## 2016-03-10 DIAGNOSIS — R5381 Other malaise: Secondary | ICD-10-CM | POA: Diagnosis not present

## 2016-03-10 DIAGNOSIS — C9000 Multiple myeloma not having achieved remission: Secondary | ICD-10-CM | POA: Diagnosis not present

## 2016-03-10 DIAGNOSIS — R29898 Other symptoms and signs involving the musculoskeletal system: Secondary | ICD-10-CM | POA: Diagnosis not present

## 2016-03-10 DIAGNOSIS — G629 Polyneuropathy, unspecified: Secondary | ICD-10-CM | POA: Diagnosis not present

## 2016-03-10 DIAGNOSIS — M6281 Muscle weakness (generalized): Secondary | ICD-10-CM | POA: Diagnosis not present

## 2016-03-13 DIAGNOSIS — G629 Polyneuropathy, unspecified: Secondary | ICD-10-CM | POA: Diagnosis not present

## 2016-03-13 DIAGNOSIS — R29898 Other symptoms and signs involving the musculoskeletal system: Secondary | ICD-10-CM | POA: Diagnosis not present

## 2016-03-13 DIAGNOSIS — M6281 Muscle weakness (generalized): Secondary | ICD-10-CM | POA: Diagnosis not present

## 2016-03-13 DIAGNOSIS — R5381 Other malaise: Secondary | ICD-10-CM | POA: Diagnosis not present

## 2016-03-17 DIAGNOSIS — G629 Polyneuropathy, unspecified: Secondary | ICD-10-CM | POA: Diagnosis not present

## 2016-03-17 DIAGNOSIS — M6281 Muscle weakness (generalized): Secondary | ICD-10-CM | POA: Diagnosis not present

## 2016-03-17 DIAGNOSIS — R5381 Other malaise: Secondary | ICD-10-CM | POA: Diagnosis not present

## 2016-03-17 DIAGNOSIS — R29898 Other symptoms and signs involving the musculoskeletal system: Secondary | ICD-10-CM | POA: Diagnosis not present

## 2016-03-19 DIAGNOSIS — R29898 Other symptoms and signs involving the musculoskeletal system: Secondary | ICD-10-CM | POA: Diagnosis not present

## 2016-03-19 DIAGNOSIS — G629 Polyneuropathy, unspecified: Secondary | ICD-10-CM | POA: Diagnosis not present

## 2016-03-19 DIAGNOSIS — R5381 Other malaise: Secondary | ICD-10-CM | POA: Diagnosis not present

## 2016-03-19 DIAGNOSIS — M6281 Muscle weakness (generalized): Secondary | ICD-10-CM | POA: Diagnosis not present

## 2016-03-24 DIAGNOSIS — G629 Polyneuropathy, unspecified: Secondary | ICD-10-CM | POA: Diagnosis not present

## 2016-03-24 DIAGNOSIS — M6281 Muscle weakness (generalized): Secondary | ICD-10-CM | POA: Diagnosis not present

## 2016-03-24 DIAGNOSIS — R29898 Other symptoms and signs involving the musculoskeletal system: Secondary | ICD-10-CM | POA: Diagnosis not present

## 2016-03-24 DIAGNOSIS — R5381 Other malaise: Secondary | ICD-10-CM | POA: Diagnosis not present

## 2016-03-30 DIAGNOSIS — C9000 Multiple myeloma not having achieved remission: Secondary | ICD-10-CM | POA: Diagnosis not present

## 2016-04-02 DIAGNOSIS — R5381 Other malaise: Secondary | ICD-10-CM | POA: Diagnosis not present

## 2016-04-02 DIAGNOSIS — M6281 Muscle weakness (generalized): Secondary | ICD-10-CM | POA: Diagnosis not present

## 2016-04-02 DIAGNOSIS — G629 Polyneuropathy, unspecified: Secondary | ICD-10-CM | POA: Diagnosis not present

## 2016-04-02 DIAGNOSIS — R29898 Other symptoms and signs involving the musculoskeletal system: Secondary | ICD-10-CM | POA: Diagnosis not present

## 2016-04-06 DIAGNOSIS — M4806 Spinal stenosis, lumbar region: Secondary | ICD-10-CM | POA: Diagnosis not present

## 2016-04-06 DIAGNOSIS — E785 Hyperlipidemia, unspecified: Secondary | ICD-10-CM | POA: Diagnosis not present

## 2016-04-06 DIAGNOSIS — R21 Rash and other nonspecific skin eruption: Secondary | ICD-10-CM | POA: Diagnosis not present

## 2016-04-06 DIAGNOSIS — C9001 Multiple myeloma in remission: Secondary | ICD-10-CM | POA: Diagnosis not present

## 2016-04-06 DIAGNOSIS — D539 Nutritional anemia, unspecified: Secondary | ICD-10-CM | POA: Diagnosis not present

## 2016-04-06 DIAGNOSIS — I10 Essential (primary) hypertension: Secondary | ICD-10-CM | POA: Diagnosis not present

## 2016-04-06 DIAGNOSIS — C9000 Multiple myeloma not having achieved remission: Secondary | ICD-10-CM | POA: Diagnosis not present

## 2016-04-06 DIAGNOSIS — Z9484 Stem cells transplant status: Secondary | ICD-10-CM | POA: Diagnosis not present

## 2016-04-07 DIAGNOSIS — R29898 Other symptoms and signs involving the musculoskeletal system: Secondary | ICD-10-CM | POA: Diagnosis not present

## 2016-04-07 DIAGNOSIS — G629 Polyneuropathy, unspecified: Secondary | ICD-10-CM | POA: Diagnosis not present

## 2016-04-07 DIAGNOSIS — R5381 Other malaise: Secondary | ICD-10-CM | POA: Diagnosis not present

## 2016-04-07 DIAGNOSIS — M6281 Muscle weakness (generalized): Secondary | ICD-10-CM | POA: Diagnosis not present

## 2016-04-09 DIAGNOSIS — R29898 Other symptoms and signs involving the musculoskeletal system: Secondary | ICD-10-CM | POA: Diagnosis not present

## 2016-04-09 DIAGNOSIS — R5381 Other malaise: Secondary | ICD-10-CM | POA: Diagnosis not present

## 2016-04-09 DIAGNOSIS — M6281 Muscle weakness (generalized): Secondary | ICD-10-CM | POA: Diagnosis not present

## 2016-04-09 DIAGNOSIS — G629 Polyneuropathy, unspecified: Secondary | ICD-10-CM | POA: Diagnosis not present

## 2016-04-14 DIAGNOSIS — M6281 Muscle weakness (generalized): Secondary | ICD-10-CM | POA: Diagnosis not present

## 2016-04-14 DIAGNOSIS — R5381 Other malaise: Secondary | ICD-10-CM | POA: Diagnosis not present

## 2016-04-14 DIAGNOSIS — R29898 Other symptoms and signs involving the musculoskeletal system: Secondary | ICD-10-CM | POA: Diagnosis not present

## 2016-04-14 DIAGNOSIS — G629 Polyneuropathy, unspecified: Secondary | ICD-10-CM | POA: Diagnosis not present

## 2016-04-16 DIAGNOSIS — M6281 Muscle weakness (generalized): Secondary | ICD-10-CM | POA: Diagnosis not present

## 2016-04-16 DIAGNOSIS — R5381 Other malaise: Secondary | ICD-10-CM | POA: Diagnosis not present

## 2016-04-16 DIAGNOSIS — G629 Polyneuropathy, unspecified: Secondary | ICD-10-CM | POA: Diagnosis not present

## 2016-04-16 DIAGNOSIS — R29898 Other symptoms and signs involving the musculoskeletal system: Secondary | ICD-10-CM | POA: Diagnosis not present

## 2016-04-21 DIAGNOSIS — G629 Polyneuropathy, unspecified: Secondary | ICD-10-CM | POA: Diagnosis not present

## 2016-04-21 DIAGNOSIS — R5381 Other malaise: Secondary | ICD-10-CM | POA: Diagnosis not present

## 2016-04-21 DIAGNOSIS — R29898 Other symptoms and signs involving the musculoskeletal system: Secondary | ICD-10-CM | POA: Diagnosis not present

## 2016-04-21 DIAGNOSIS — M6281 Muscle weakness (generalized): Secondary | ICD-10-CM | POA: Diagnosis not present

## 2016-04-23 DIAGNOSIS — G629 Polyneuropathy, unspecified: Secondary | ICD-10-CM | POA: Diagnosis not present

## 2016-04-23 DIAGNOSIS — M6281 Muscle weakness (generalized): Secondary | ICD-10-CM | POA: Diagnosis not present

## 2016-04-23 DIAGNOSIS — R29898 Other symptoms and signs involving the musculoskeletal system: Secondary | ICD-10-CM | POA: Diagnosis not present

## 2016-04-23 DIAGNOSIS — R5381 Other malaise: Secondary | ICD-10-CM | POA: Diagnosis not present

## 2016-04-28 DIAGNOSIS — R5381 Other malaise: Secondary | ICD-10-CM | POA: Diagnosis not present

## 2016-04-28 DIAGNOSIS — G629 Polyneuropathy, unspecified: Secondary | ICD-10-CM | POA: Diagnosis not present

## 2016-04-28 DIAGNOSIS — M6281 Muscle weakness (generalized): Secondary | ICD-10-CM | POA: Diagnosis not present

## 2016-04-28 DIAGNOSIS — R29898 Other symptoms and signs involving the musculoskeletal system: Secondary | ICD-10-CM | POA: Diagnosis not present

## 2016-04-29 DIAGNOSIS — C9000 Multiple myeloma not having achieved remission: Secondary | ICD-10-CM | POA: Diagnosis not present

## 2016-04-30 DIAGNOSIS — M6281 Muscle weakness (generalized): Secondary | ICD-10-CM | POA: Diagnosis not present

## 2016-04-30 DIAGNOSIS — R29898 Other symptoms and signs involving the musculoskeletal system: Secondary | ICD-10-CM | POA: Diagnosis not present

## 2016-04-30 DIAGNOSIS — R5381 Other malaise: Secondary | ICD-10-CM | POA: Diagnosis not present

## 2016-04-30 DIAGNOSIS — G629 Polyneuropathy, unspecified: Secondary | ICD-10-CM | POA: Diagnosis not present

## 2016-05-05 DIAGNOSIS — G629 Polyneuropathy, unspecified: Secondary | ICD-10-CM | POA: Diagnosis not present

## 2016-05-05 DIAGNOSIS — R5381 Other malaise: Secondary | ICD-10-CM | POA: Diagnosis not present

## 2016-05-05 DIAGNOSIS — M6281 Muscle weakness (generalized): Secondary | ICD-10-CM | POA: Diagnosis not present

## 2016-05-05 DIAGNOSIS — R29898 Other symptoms and signs involving the musculoskeletal system: Secondary | ICD-10-CM | POA: Diagnosis not present

## 2016-05-07 DIAGNOSIS — M6281 Muscle weakness (generalized): Secondary | ICD-10-CM | POA: Diagnosis not present

## 2016-05-07 DIAGNOSIS — G629 Polyneuropathy, unspecified: Secondary | ICD-10-CM | POA: Diagnosis not present

## 2016-05-07 DIAGNOSIS — R29898 Other symptoms and signs involving the musculoskeletal system: Secondary | ICD-10-CM | POA: Diagnosis not present

## 2016-05-07 DIAGNOSIS — R5381 Other malaise: Secondary | ICD-10-CM | POA: Diagnosis not present

## 2016-05-08 DIAGNOSIS — Z9481 Bone marrow transplant status: Secondary | ICD-10-CM | POA: Diagnosis not present

## 2016-05-08 DIAGNOSIS — Z23 Encounter for immunization: Secondary | ICD-10-CM | POA: Diagnosis not present

## 2016-05-08 DIAGNOSIS — C9000 Multiple myeloma not having achieved remission: Secondary | ICD-10-CM | POA: Diagnosis not present

## 2016-05-12 DIAGNOSIS — G629 Polyneuropathy, unspecified: Secondary | ICD-10-CM | POA: Diagnosis not present

## 2016-05-12 DIAGNOSIS — R5381 Other malaise: Secondary | ICD-10-CM | POA: Diagnosis not present

## 2016-05-12 DIAGNOSIS — M6281 Muscle weakness (generalized): Secondary | ICD-10-CM | POA: Diagnosis not present

## 2016-05-12 DIAGNOSIS — R29898 Other symptoms and signs involving the musculoskeletal system: Secondary | ICD-10-CM | POA: Diagnosis not present

## 2016-05-19 DIAGNOSIS — R5381 Other malaise: Secondary | ICD-10-CM | POA: Diagnosis not present

## 2016-05-19 DIAGNOSIS — M6281 Muscle weakness (generalized): Secondary | ICD-10-CM | POA: Diagnosis not present

## 2016-05-19 DIAGNOSIS — R29898 Other symptoms and signs involving the musculoskeletal system: Secondary | ICD-10-CM | POA: Diagnosis not present

## 2016-05-19 DIAGNOSIS — G629 Polyneuropathy, unspecified: Secondary | ICD-10-CM | POA: Diagnosis not present

## 2016-05-21 DIAGNOSIS — R29898 Other symptoms and signs involving the musculoskeletal system: Secondary | ICD-10-CM | POA: Diagnosis not present

## 2016-05-21 DIAGNOSIS — R5381 Other malaise: Secondary | ICD-10-CM | POA: Diagnosis not present

## 2016-05-21 DIAGNOSIS — G629 Polyneuropathy, unspecified: Secondary | ICD-10-CM | POA: Diagnosis not present

## 2016-05-21 DIAGNOSIS — M6281 Muscle weakness (generalized): Secondary | ICD-10-CM | POA: Diagnosis not present

## 2016-05-26 DIAGNOSIS — M6281 Muscle weakness (generalized): Secondary | ICD-10-CM | POA: Diagnosis not present

## 2016-05-26 DIAGNOSIS — R5381 Other malaise: Secondary | ICD-10-CM | POA: Diagnosis not present

## 2016-05-26 DIAGNOSIS — G629 Polyneuropathy, unspecified: Secondary | ICD-10-CM | POA: Diagnosis not present

## 2016-05-26 DIAGNOSIS — R29898 Other symptoms and signs involving the musculoskeletal system: Secondary | ICD-10-CM | POA: Diagnosis not present

## 2016-05-28 DIAGNOSIS — R5381 Other malaise: Secondary | ICD-10-CM | POA: Diagnosis not present

## 2016-05-28 DIAGNOSIS — G629 Polyneuropathy, unspecified: Secondary | ICD-10-CM | POA: Diagnosis not present

## 2016-05-28 DIAGNOSIS — R29898 Other symptoms and signs involving the musculoskeletal system: Secondary | ICD-10-CM | POA: Diagnosis not present

## 2016-05-28 DIAGNOSIS — M6281 Muscle weakness (generalized): Secondary | ICD-10-CM | POA: Diagnosis not present

## 2016-05-30 DIAGNOSIS — C9000 Multiple myeloma not having achieved remission: Secondary | ICD-10-CM | POA: Diagnosis not present

## 2016-06-02 DIAGNOSIS — M6281 Muscle weakness (generalized): Secondary | ICD-10-CM | POA: Diagnosis not present

## 2016-06-02 DIAGNOSIS — R29898 Other symptoms and signs involving the musculoskeletal system: Secondary | ICD-10-CM | POA: Diagnosis not present

## 2016-06-02 DIAGNOSIS — R5381 Other malaise: Secondary | ICD-10-CM | POA: Diagnosis not present

## 2016-06-02 DIAGNOSIS — G629 Polyneuropathy, unspecified: Secondary | ICD-10-CM | POA: Diagnosis not present

## 2016-06-08 DIAGNOSIS — C9000 Multiple myeloma not having achieved remission: Secondary | ICD-10-CM | POA: Diagnosis not present

## 2016-06-08 DIAGNOSIS — C9001 Multiple myeloma in remission: Secondary | ICD-10-CM | POA: Diagnosis not present

## 2016-06-09 DIAGNOSIS — R5381 Other malaise: Secondary | ICD-10-CM | POA: Diagnosis not present

## 2016-06-09 DIAGNOSIS — M6281 Muscle weakness (generalized): Secondary | ICD-10-CM | POA: Diagnosis not present

## 2016-06-09 DIAGNOSIS — R29898 Other symptoms and signs involving the musculoskeletal system: Secondary | ICD-10-CM | POA: Diagnosis not present

## 2016-06-09 DIAGNOSIS — G629 Polyneuropathy, unspecified: Secondary | ICD-10-CM | POA: Diagnosis not present

## 2016-06-11 DIAGNOSIS — G629 Polyneuropathy, unspecified: Secondary | ICD-10-CM | POA: Diagnosis not present

## 2016-06-11 DIAGNOSIS — R5381 Other malaise: Secondary | ICD-10-CM | POA: Diagnosis not present

## 2016-06-11 DIAGNOSIS — R29898 Other symptoms and signs involving the musculoskeletal system: Secondary | ICD-10-CM | POA: Diagnosis not present

## 2016-06-11 DIAGNOSIS — M6281 Muscle weakness (generalized): Secondary | ICD-10-CM | POA: Diagnosis not present

## 2016-06-16 DIAGNOSIS — G629 Polyneuropathy, unspecified: Secondary | ICD-10-CM | POA: Diagnosis not present

## 2016-06-16 DIAGNOSIS — R5381 Other malaise: Secondary | ICD-10-CM | POA: Diagnosis not present

## 2016-06-16 DIAGNOSIS — R29898 Other symptoms and signs involving the musculoskeletal system: Secondary | ICD-10-CM | POA: Diagnosis not present

## 2016-06-16 DIAGNOSIS — M6281 Muscle weakness (generalized): Secondary | ICD-10-CM | POA: Diagnosis not present

## 2016-06-18 DIAGNOSIS — R5381 Other malaise: Secondary | ICD-10-CM | POA: Diagnosis not present

## 2016-06-18 DIAGNOSIS — G629 Polyneuropathy, unspecified: Secondary | ICD-10-CM | POA: Diagnosis not present

## 2016-06-18 DIAGNOSIS — M6281 Muscle weakness (generalized): Secondary | ICD-10-CM | POA: Diagnosis not present

## 2016-06-18 DIAGNOSIS — R29898 Other symptoms and signs involving the musculoskeletal system: Secondary | ICD-10-CM | POA: Diagnosis not present

## 2016-06-23 DIAGNOSIS — G629 Polyneuropathy, unspecified: Secondary | ICD-10-CM | POA: Diagnosis not present

## 2016-06-23 DIAGNOSIS — R29898 Other symptoms and signs involving the musculoskeletal system: Secondary | ICD-10-CM | POA: Diagnosis not present

## 2016-06-23 DIAGNOSIS — M6281 Muscle weakness (generalized): Secondary | ICD-10-CM | POA: Diagnosis not present

## 2016-06-23 DIAGNOSIS — R5381 Other malaise: Secondary | ICD-10-CM | POA: Diagnosis not present

## 2016-06-25 DIAGNOSIS — M6281 Muscle weakness (generalized): Secondary | ICD-10-CM | POA: Diagnosis not present

## 2016-06-25 DIAGNOSIS — R5381 Other malaise: Secondary | ICD-10-CM | POA: Diagnosis not present

## 2016-06-25 DIAGNOSIS — R29898 Other symptoms and signs involving the musculoskeletal system: Secondary | ICD-10-CM | POA: Diagnosis not present

## 2016-06-25 DIAGNOSIS — G629 Polyneuropathy, unspecified: Secondary | ICD-10-CM | POA: Diagnosis not present

## 2016-06-29 DIAGNOSIS — C9000 Multiple myeloma not having achieved remission: Secondary | ICD-10-CM | POA: Diagnosis not present

## 2016-06-30 DIAGNOSIS — R29898 Other symptoms and signs involving the musculoskeletal system: Secondary | ICD-10-CM | POA: Diagnosis not present

## 2016-06-30 DIAGNOSIS — M6281 Muscle weakness (generalized): Secondary | ICD-10-CM | POA: Diagnosis not present

## 2016-06-30 DIAGNOSIS — R5381 Other malaise: Secondary | ICD-10-CM | POA: Diagnosis not present

## 2016-06-30 DIAGNOSIS — G629 Polyneuropathy, unspecified: Secondary | ICD-10-CM | POA: Diagnosis not present

## 2016-07-02 DIAGNOSIS — R5381 Other malaise: Secondary | ICD-10-CM | POA: Diagnosis not present

## 2016-07-02 DIAGNOSIS — G629 Polyneuropathy, unspecified: Secondary | ICD-10-CM | POA: Diagnosis not present

## 2016-07-02 DIAGNOSIS — R29898 Other symptoms and signs involving the musculoskeletal system: Secondary | ICD-10-CM | POA: Diagnosis not present

## 2016-07-02 DIAGNOSIS — M6281 Muscle weakness (generalized): Secondary | ICD-10-CM | POA: Diagnosis not present

## 2016-07-07 DIAGNOSIS — C9000 Multiple myeloma not having achieved remission: Secondary | ICD-10-CM | POA: Diagnosis not present

## 2016-07-08 DIAGNOSIS — R29898 Other symptoms and signs involving the musculoskeletal system: Secondary | ICD-10-CM | POA: Diagnosis not present

## 2016-07-08 DIAGNOSIS — M6281 Muscle weakness (generalized): Secondary | ICD-10-CM | POA: Diagnosis not present

## 2016-07-08 DIAGNOSIS — G629 Polyneuropathy, unspecified: Secondary | ICD-10-CM | POA: Diagnosis not present

## 2016-07-08 DIAGNOSIS — R5381 Other malaise: Secondary | ICD-10-CM | POA: Diagnosis not present

## 2016-07-10 DIAGNOSIS — R5381 Other malaise: Secondary | ICD-10-CM | POA: Diagnosis not present

## 2016-07-10 DIAGNOSIS — R29898 Other symptoms and signs involving the musculoskeletal system: Secondary | ICD-10-CM | POA: Diagnosis not present

## 2016-07-10 DIAGNOSIS — M6281 Muscle weakness (generalized): Secondary | ICD-10-CM | POA: Diagnosis not present

## 2016-07-10 DIAGNOSIS — G629 Polyneuropathy, unspecified: Secondary | ICD-10-CM | POA: Diagnosis not present

## 2016-07-30 DIAGNOSIS — C9000 Multiple myeloma not having achieved remission: Secondary | ICD-10-CM | POA: Diagnosis not present

## 2016-08-03 DIAGNOSIS — C9000 Multiple myeloma not having achieved remission: Secondary | ICD-10-CM | POA: Diagnosis not present

## 2016-08-03 DIAGNOSIS — R29898 Other symptoms and signs involving the musculoskeletal system: Secondary | ICD-10-CM | POA: Diagnosis not present

## 2016-08-03 DIAGNOSIS — Z9481 Bone marrow transplant status: Secondary | ICD-10-CM | POA: Diagnosis not present

## 2016-08-03 DIAGNOSIS — M48061 Spinal stenosis, lumbar region without neurogenic claudication: Secondary | ICD-10-CM | POA: Diagnosis not present

## 2016-08-26 DIAGNOSIS — C9000 Multiple myeloma not having achieved remission: Secondary | ICD-10-CM | POA: Diagnosis not present

## 2016-08-30 DIAGNOSIS — C9000 Multiple myeloma not having achieved remission: Secondary | ICD-10-CM | POA: Diagnosis not present

## 2016-10-05 DIAGNOSIS — D539 Nutritional anemia, unspecified: Secondary | ICD-10-CM | POA: Diagnosis not present

## 2016-10-05 DIAGNOSIS — M10372 Gout due to renal impairment, left ankle and foot: Secondary | ICD-10-CM | POA: Diagnosis not present

## 2016-10-05 DIAGNOSIS — M109 Gout, unspecified: Secondary | ICD-10-CM | POA: Diagnosis not present

## 2016-10-05 DIAGNOSIS — R531 Weakness: Secondary | ICD-10-CM | POA: Diagnosis not present

## 2016-10-05 DIAGNOSIS — E785 Hyperlipidemia, unspecified: Secondary | ICD-10-CM | POA: Diagnosis not present

## 2016-10-05 DIAGNOSIS — M79604 Pain in right leg: Secondary | ICD-10-CM | POA: Diagnosis not present

## 2016-10-05 DIAGNOSIS — M549 Dorsalgia, unspecified: Secondary | ICD-10-CM | POA: Diagnosis not present

## 2016-10-05 DIAGNOSIS — Z23 Encounter for immunization: Secondary | ICD-10-CM | POA: Diagnosis not present

## 2016-10-05 DIAGNOSIS — C9000 Multiple myeloma not having achieved remission: Secondary | ICD-10-CM | POA: Diagnosis not present

## 2016-10-05 DIAGNOSIS — I10 Essential (primary) hypertension: Secondary | ICD-10-CM | POA: Diagnosis not present

## 2016-10-12 DIAGNOSIS — M10372 Gout due to renal impairment, left ankle and foot: Secondary | ICD-10-CM | POA: Diagnosis not present

## 2016-10-28 DIAGNOSIS — I129 Hypertensive chronic kidney disease with stage 1 through stage 4 chronic kidney disease, or unspecified chronic kidney disease: Secondary | ICD-10-CM | POA: Diagnosis not present

## 2016-10-28 DIAGNOSIS — C9000 Multiple myeloma not having achieved remission: Secondary | ICD-10-CM | POA: Diagnosis not present

## 2016-10-28 DIAGNOSIS — N189 Chronic kidney disease, unspecified: Secondary | ICD-10-CM | POA: Diagnosis not present

## 2016-10-28 DIAGNOSIS — D649 Anemia, unspecified: Secondary | ICD-10-CM | POA: Diagnosis not present

## 2016-10-28 DIAGNOSIS — D631 Anemia in chronic kidney disease: Secondary | ICD-10-CM | POA: Diagnosis not present

## 2016-10-28 DIAGNOSIS — M79604 Pain in right leg: Secondary | ICD-10-CM | POA: Diagnosis not present

## 2016-10-28 DIAGNOSIS — C9001 Multiple myeloma in remission: Secondary | ICD-10-CM | POA: Diagnosis not present

## 2016-10-28 DIAGNOSIS — Z9484 Stem cells transplant status: Secondary | ICD-10-CM | POA: Diagnosis not present

## 2016-10-28 DIAGNOSIS — E785 Hyperlipidemia, unspecified: Secondary | ICD-10-CM | POA: Diagnosis not present

## 2016-10-28 DIAGNOSIS — R21 Rash and other nonspecific skin eruption: Secondary | ICD-10-CM | POA: Diagnosis not present

## 2016-10-28 DIAGNOSIS — M549 Dorsalgia, unspecified: Secondary | ICD-10-CM | POA: Diagnosis not present

## 2016-10-28 DIAGNOSIS — T7840XA Allergy, unspecified, initial encounter: Secondary | ICD-10-CM | POA: Diagnosis not present

## 2016-11-12 DIAGNOSIS — C9001 Multiple myeloma in remission: Secondary | ICD-10-CM | POA: Diagnosis not present

## 2016-11-12 DIAGNOSIS — D649 Anemia, unspecified: Secondary | ICD-10-CM | POA: Diagnosis not present

## 2016-11-26 DIAGNOSIS — Z7982 Long term (current) use of aspirin: Secondary | ICD-10-CM | POA: Diagnosis not present

## 2016-11-26 DIAGNOSIS — C9001 Multiple myeloma in remission: Secondary | ICD-10-CM | POA: Diagnosis not present

## 2016-11-26 DIAGNOSIS — M25551 Pain in right hip: Secondary | ICD-10-CM | POA: Diagnosis not present

## 2016-11-26 DIAGNOSIS — M109 Gout, unspecified: Secondary | ICD-10-CM | POA: Diagnosis not present

## 2016-11-26 DIAGNOSIS — I10 Essential (primary) hypertension: Secondary | ICD-10-CM | POA: Diagnosis not present

## 2016-11-26 DIAGNOSIS — Z9484 Stem cells transplant status: Secondary | ICD-10-CM | POA: Diagnosis not present

## 2016-11-26 DIAGNOSIS — E785 Hyperlipidemia, unspecified: Secondary | ICD-10-CM | POA: Diagnosis not present

## 2016-11-26 DIAGNOSIS — I129 Hypertensive chronic kidney disease with stage 1 through stage 4 chronic kidney disease, or unspecified chronic kidney disease: Secondary | ICD-10-CM | POA: Diagnosis not present

## 2016-11-26 DIAGNOSIS — J019 Acute sinusitis, unspecified: Secondary | ICD-10-CM | POA: Diagnosis not present

## 2016-11-26 DIAGNOSIS — Z862 Personal history of diseases of the blood and blood-forming organs and certain disorders involving the immune mechanism: Secondary | ICD-10-CM | POA: Diagnosis not present

## 2016-11-26 DIAGNOSIS — C9000 Multiple myeloma not having achieved remission: Secondary | ICD-10-CM | POA: Diagnosis not present

## 2016-11-26 DIAGNOSIS — E559 Vitamin D deficiency, unspecified: Secondary | ICD-10-CM | POA: Diagnosis not present

## 2016-11-26 DIAGNOSIS — D539 Nutritional anemia, unspecified: Secondary | ICD-10-CM | POA: Diagnosis not present

## 2016-11-26 DIAGNOSIS — M79604 Pain in right leg: Secondary | ICD-10-CM | POA: Diagnosis not present

## 2016-11-26 DIAGNOSIS — Z79899 Other long term (current) drug therapy: Secondary | ICD-10-CM | POA: Diagnosis not present

## 2016-11-26 DIAGNOSIS — D61818 Other pancytopenia: Secondary | ICD-10-CM | POA: Diagnosis not present

## 2016-11-26 DIAGNOSIS — M48061 Spinal stenosis, lumbar region without neurogenic claudication: Secondary | ICD-10-CM | POA: Diagnosis not present

## 2016-11-26 DIAGNOSIS — N189 Chronic kidney disease, unspecified: Secondary | ICD-10-CM | POA: Diagnosis not present

## 2016-11-26 DIAGNOSIS — R21 Rash and other nonspecific skin eruption: Secondary | ICD-10-CM | POA: Diagnosis not present

## 2016-11-26 DIAGNOSIS — R531 Weakness: Secondary | ICD-10-CM | POA: Diagnosis not present

## 2016-11-26 DIAGNOSIS — Z48298 Encounter for aftercare following other organ transplant: Secondary | ICD-10-CM | POA: Diagnosis not present

## 2016-11-26 DIAGNOSIS — N183 Chronic kidney disease, stage 3 (moderate): Secondary | ICD-10-CM | POA: Diagnosis not present

## 2016-11-26 DIAGNOSIS — M545 Low back pain: Secondary | ICD-10-CM | POA: Diagnosis not present

## 2016-11-27 DIAGNOSIS — Z862 Personal history of diseases of the blood and blood-forming organs and certain disorders involving the immune mechanism: Secondary | ICD-10-CM | POA: Diagnosis not present

## 2016-11-27 DIAGNOSIS — D61818 Other pancytopenia: Secondary | ICD-10-CM | POA: Diagnosis not present

## 2016-11-27 DIAGNOSIS — M48061 Spinal stenosis, lumbar region without neurogenic claudication: Secondary | ICD-10-CM | POA: Diagnosis not present

## 2016-11-27 DIAGNOSIS — C9000 Multiple myeloma not having achieved remission: Secondary | ICD-10-CM | POA: Diagnosis not present

## 2016-11-27 DIAGNOSIS — M545 Low back pain: Secondary | ICD-10-CM | POA: Diagnosis not present

## 2016-11-27 DIAGNOSIS — M47816 Spondylosis without myelopathy or radiculopathy, lumbar region: Secondary | ICD-10-CM | POA: Diagnosis not present

## 2016-12-02 DIAGNOSIS — C9000 Multiple myeloma not having achieved remission: Secondary | ICD-10-CM | POA: Diagnosis not present

## 2016-12-02 DIAGNOSIS — I10 Essential (primary) hypertension: Secondary | ICD-10-CM | POA: Diagnosis not present

## 2016-12-02 DIAGNOSIS — D539 Nutritional anemia, unspecified: Secondary | ICD-10-CM | POA: Diagnosis not present

## 2016-12-02 DIAGNOSIS — J019 Acute sinusitis, unspecified: Secondary | ICD-10-CM | POA: Diagnosis not present

## 2016-12-02 DIAGNOSIS — R531 Weakness: Secondary | ICD-10-CM | POA: Diagnosis not present

## 2016-12-02 DIAGNOSIS — R21 Rash and other nonspecific skin eruption: Secondary | ICD-10-CM | POA: Diagnosis not present

## 2016-12-02 DIAGNOSIS — M79604 Pain in right leg: Secondary | ICD-10-CM | POA: Diagnosis not present

## 2016-12-02 DIAGNOSIS — Z7982 Long term (current) use of aspirin: Secondary | ICD-10-CM | POA: Diagnosis not present

## 2016-12-02 DIAGNOSIS — M549 Dorsalgia, unspecified: Secondary | ICD-10-CM | POA: Diagnosis not present

## 2016-12-02 DIAGNOSIS — E785 Hyperlipidemia, unspecified: Secondary | ICD-10-CM | POA: Diagnosis not present

## 2016-12-02 DIAGNOSIS — N183 Chronic kidney disease, stage 3 (moderate): Secondary | ICD-10-CM | POA: Diagnosis not present

## 2016-12-02 DIAGNOSIS — E559 Vitamin D deficiency, unspecified: Secondary | ICD-10-CM | POA: Diagnosis not present

## 2016-12-09 DIAGNOSIS — C9001 Multiple myeloma in remission: Secondary | ICD-10-CM | POA: Diagnosis not present

## 2016-12-09 DIAGNOSIS — C9000 Multiple myeloma not having achieved remission: Secondary | ICD-10-CM | POA: Diagnosis not present

## 2016-12-09 DIAGNOSIS — D61818 Other pancytopenia: Secondary | ICD-10-CM | POA: Diagnosis not present

## 2016-12-10 DIAGNOSIS — M533 Sacrococcygeal disorders, not elsewhere classified: Secondary | ICD-10-CM | POA: Diagnosis not present

## 2016-12-10 DIAGNOSIS — C9000 Multiple myeloma not having achieved remission: Secondary | ICD-10-CM | POA: Diagnosis not present

## 2016-12-17 DIAGNOSIS — L270 Generalized skin eruption due to drugs and medicaments taken internally: Secondary | ICD-10-CM | POA: Diagnosis not present

## 2016-12-17 DIAGNOSIS — M79606 Pain in leg, unspecified: Secondary | ICD-10-CM | POA: Diagnosis not present

## 2016-12-17 DIAGNOSIS — N183 Chronic kidney disease, stage 3 (moderate): Secondary | ICD-10-CM | POA: Diagnosis not present

## 2016-12-17 DIAGNOSIS — R531 Weakness: Secondary | ICD-10-CM | POA: Diagnosis not present

## 2016-12-17 DIAGNOSIS — R21 Rash and other nonspecific skin eruption: Secondary | ICD-10-CM | POA: Diagnosis not present

## 2016-12-17 DIAGNOSIS — M48061 Spinal stenosis, lumbar region without neurogenic claudication: Secondary | ICD-10-CM | POA: Diagnosis not present

## 2016-12-17 DIAGNOSIS — M549 Dorsalgia, unspecified: Secondary | ICD-10-CM | POA: Diagnosis not present

## 2016-12-17 DIAGNOSIS — Z9484 Stem cells transplant status: Secondary | ICD-10-CM | POA: Diagnosis not present

## 2016-12-17 DIAGNOSIS — E559 Vitamin D deficiency, unspecified: Secondary | ICD-10-CM | POA: Diagnosis not present

## 2016-12-17 DIAGNOSIS — T50905A Adverse effect of unspecified drugs, medicaments and biological substances, initial encounter: Secondary | ICD-10-CM | POA: Diagnosis not present

## 2016-12-17 DIAGNOSIS — D61818 Other pancytopenia: Secondary | ICD-10-CM | POA: Diagnosis not present

## 2016-12-17 DIAGNOSIS — C9001 Multiple myeloma in remission: Secondary | ICD-10-CM | POA: Diagnosis not present

## 2016-12-17 DIAGNOSIS — R05 Cough: Secondary | ICD-10-CM | POA: Diagnosis not present

## 2016-12-17 DIAGNOSIS — L308 Other specified dermatitis: Secondary | ICD-10-CM | POA: Diagnosis not present

## 2016-12-24 DIAGNOSIS — C9001 Multiple myeloma in remission: Secondary | ICD-10-CM | POA: Diagnosis not present

## 2016-12-30 DIAGNOSIS — C9000 Multiple myeloma not having achieved remission: Secondary | ICD-10-CM | POA: Diagnosis not present

## 2016-12-30 DIAGNOSIS — R21 Rash and other nonspecific skin eruption: Secondary | ICD-10-CM | POA: Diagnosis not present

## 2016-12-31 DIAGNOSIS — C9002 Multiple myeloma in relapse: Secondary | ICD-10-CM | POA: Diagnosis not present

## 2017-01-07 DIAGNOSIS — C9000 Multiple myeloma not having achieved remission: Secondary | ICD-10-CM | POA: Diagnosis not present

## 2017-01-14 DIAGNOSIS — C9000 Multiple myeloma not having achieved remission: Secondary | ICD-10-CM | POA: Diagnosis not present

## 2017-01-21 DIAGNOSIS — C9000 Multiple myeloma not having achieved remission: Secondary | ICD-10-CM | POA: Diagnosis not present

## 2017-01-21 DIAGNOSIS — Z5112 Encounter for antineoplastic immunotherapy: Secondary | ICD-10-CM | POA: Diagnosis not present

## 2017-01-28 DIAGNOSIS — C9000 Multiple myeloma not having achieved remission: Secondary | ICD-10-CM | POA: Diagnosis not present

## 2017-02-04 DIAGNOSIS — R531 Weakness: Secondary | ICD-10-CM | POA: Diagnosis not present

## 2017-02-04 DIAGNOSIS — E559 Vitamin D deficiency, unspecified: Secondary | ICD-10-CM | POA: Diagnosis not present

## 2017-02-04 DIAGNOSIS — I129 Hypertensive chronic kidney disease with stage 1 through stage 4 chronic kidney disease, or unspecified chronic kidney disease: Secondary | ICD-10-CM | POA: Diagnosis not present

## 2017-02-04 DIAGNOSIS — E785 Hyperlipidemia, unspecified: Secondary | ICD-10-CM | POA: Diagnosis not present

## 2017-02-04 DIAGNOSIS — M549 Dorsalgia, unspecified: Secondary | ICD-10-CM | POA: Diagnosis not present

## 2017-02-04 DIAGNOSIS — D539 Nutritional anemia, unspecified: Secondary | ICD-10-CM | POA: Diagnosis not present

## 2017-02-04 DIAGNOSIS — N183 Chronic kidney disease, stage 3 (moderate): Secondary | ICD-10-CM | POA: Diagnosis not present

## 2017-02-04 DIAGNOSIS — C9000 Multiple myeloma not having achieved remission: Secondary | ICD-10-CM | POA: Diagnosis not present

## 2017-02-04 DIAGNOSIS — Z5112 Encounter for antineoplastic immunotherapy: Secondary | ICD-10-CM | POA: Diagnosis not present

## 2017-02-04 DIAGNOSIS — M79604 Pain in right leg: Secondary | ICD-10-CM | POA: Diagnosis not present

## 2017-02-09 DIAGNOSIS — C9000 Multiple myeloma not having achieved remission: Secondary | ICD-10-CM | POA: Diagnosis not present

## 2017-02-16 DIAGNOSIS — C9000 Multiple myeloma not having achieved remission: Secondary | ICD-10-CM | POA: Diagnosis not present

## 2017-02-23 DIAGNOSIS — C9000 Multiple myeloma not having achieved remission: Secondary | ICD-10-CM | POA: Diagnosis not present

## 2017-03-02 DIAGNOSIS — M48061 Spinal stenosis, lumbar region without neurogenic claudication: Secondary | ICD-10-CM | POA: Diagnosis not present

## 2017-03-02 DIAGNOSIS — Z79899 Other long term (current) drug therapy: Secondary | ICD-10-CM | POA: Diagnosis not present

## 2017-03-02 DIAGNOSIS — I129 Hypertensive chronic kidney disease with stage 1 through stage 4 chronic kidney disease, or unspecified chronic kidney disease: Secondary | ICD-10-CM | POA: Diagnosis not present

## 2017-03-02 DIAGNOSIS — Z9484 Stem cells transplant status: Secondary | ICD-10-CM | POA: Diagnosis not present

## 2017-03-02 DIAGNOSIS — Z5181 Encounter for therapeutic drug level monitoring: Secondary | ICD-10-CM | POA: Diagnosis not present

## 2017-03-02 DIAGNOSIS — N183 Chronic kidney disease, stage 3 (moderate): Secondary | ICD-10-CM | POA: Diagnosis not present

## 2017-03-02 DIAGNOSIS — Z48298 Encounter for aftercare following other organ transplant: Secondary | ICD-10-CM | POA: Diagnosis not present

## 2017-03-02 DIAGNOSIS — D61818 Other pancytopenia: Secondary | ICD-10-CM | POA: Diagnosis not present

## 2017-03-02 DIAGNOSIS — Z5111 Encounter for antineoplastic chemotherapy: Secondary | ICD-10-CM | POA: Diagnosis not present

## 2017-03-02 DIAGNOSIS — C9000 Multiple myeloma not having achieved remission: Secondary | ICD-10-CM | POA: Diagnosis not present

## 2017-03-02 DIAGNOSIS — E559 Vitamin D deficiency, unspecified: Secondary | ICD-10-CM | POA: Diagnosis not present

## 2017-03-09 DIAGNOSIS — C9000 Multiple myeloma not having achieved remission: Secondary | ICD-10-CM | POA: Diagnosis not present

## 2017-03-17 IMAGING — CR DG ABDOMEN 2V
2 series · 2 of 2 positions shown · non-contrast
Comparison: None.

CLINICAL DATA: Constipation for past 4 days. Multiple myeloma.
Recently completed chemotherapy.

EXAM:
ABDOMEN - 2 VIEW

[abdomen erect]
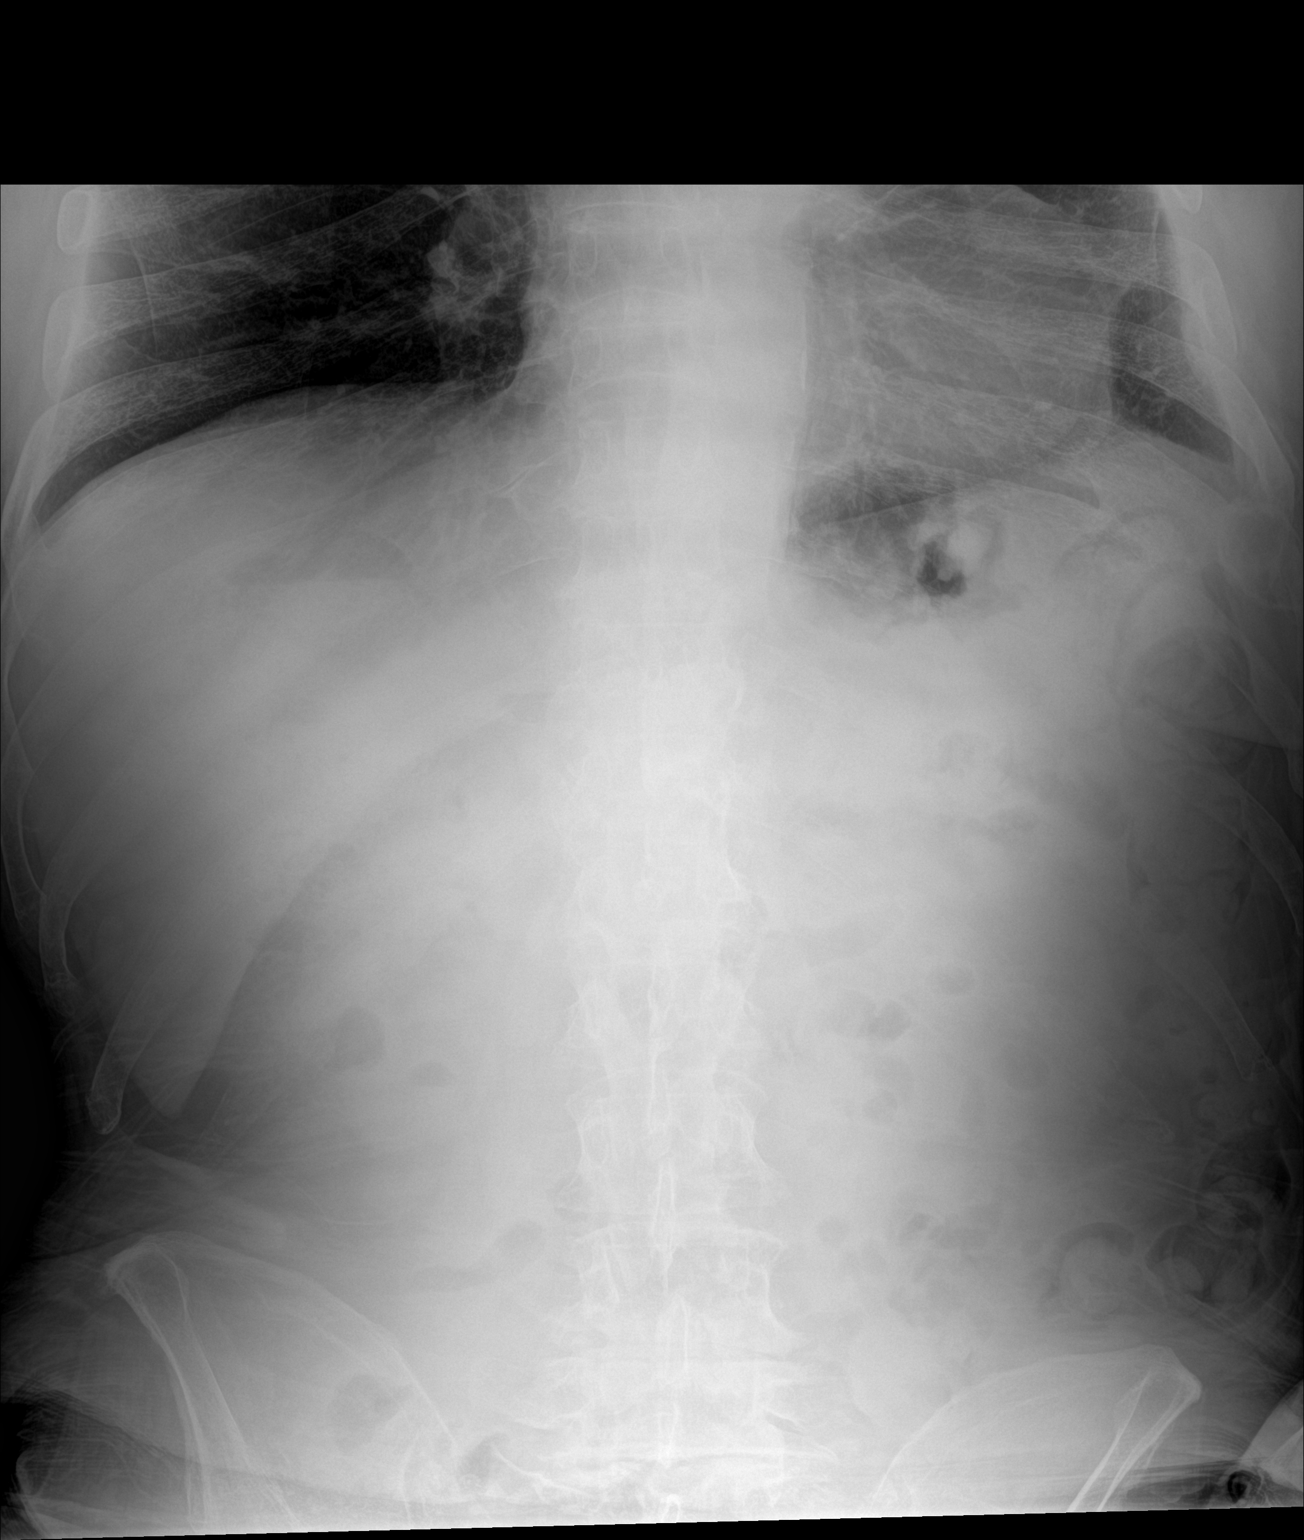

[abdomen supine]
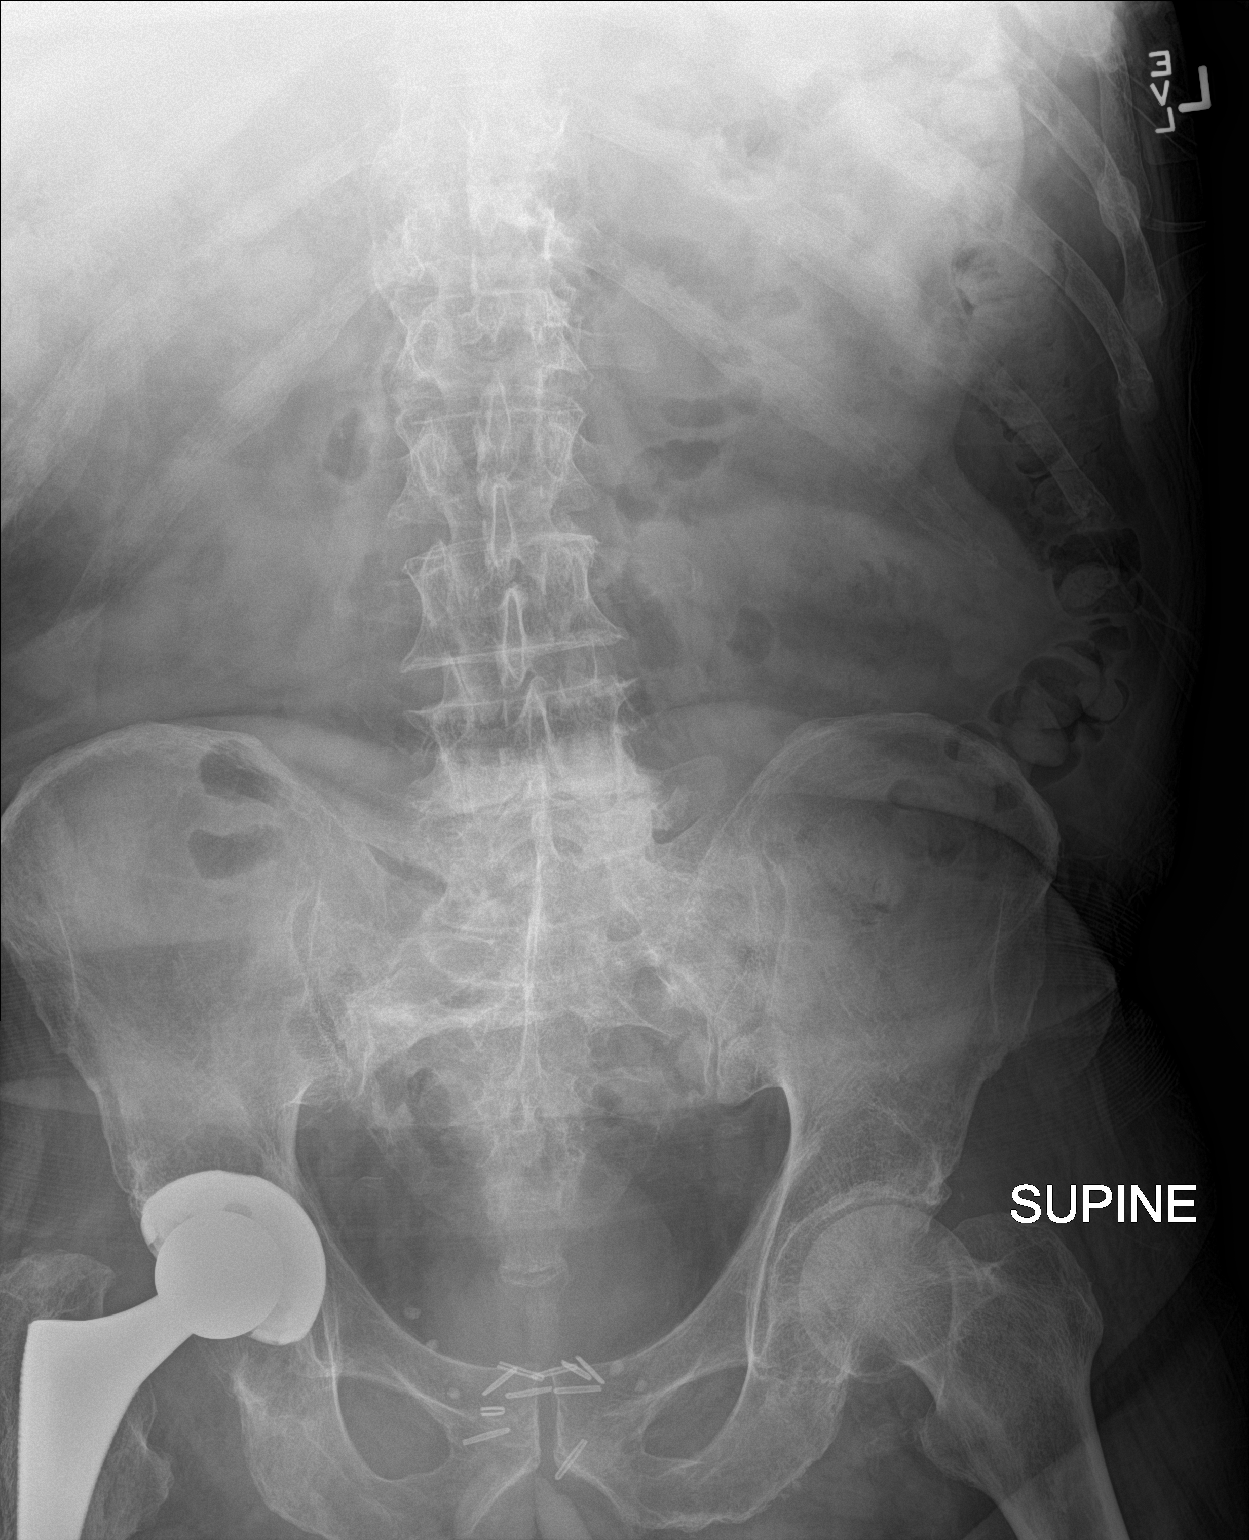

[2 of 2 positions shown; findings below may reference images not displayed]

FINDINGS: No evidence of dilated bowel loops. Small amount of stool noted in
the left colon. There is no evidence of free air. No radio-opaque
calculi identified. Surgical clips seen in the pelvis from previous
prostatectomy. Bipolar right hip prosthesis also noted as well as
advanced lower lumbar spine degenerative changes.
IMPRESSION: Unremarkable bowel gas pattern.  No acute findings.

## 2017-03-18 DIAGNOSIS — Z23 Encounter for immunization: Secondary | ICD-10-CM | POA: Diagnosis not present

## 2017-03-18 DIAGNOSIS — C9000 Multiple myeloma not having achieved remission: Secondary | ICD-10-CM | POA: Diagnosis not present

## 2017-03-18 DIAGNOSIS — Z9481 Bone marrow transplant status: Secondary | ICD-10-CM | POA: Diagnosis not present

## 2017-03-25 DIAGNOSIS — C9000 Multiple myeloma not having achieved remission: Secondary | ICD-10-CM | POA: Diagnosis not present

## 2017-03-30 DIAGNOSIS — R5383 Other fatigue: Secondary | ICD-10-CM | POA: Diagnosis not present

## 2017-03-30 DIAGNOSIS — N183 Chronic kidney disease, stage 3 (moderate): Secondary | ICD-10-CM | POA: Diagnosis not present

## 2017-03-30 DIAGNOSIS — Z5112 Encounter for antineoplastic immunotherapy: Secondary | ICD-10-CM | POA: Diagnosis not present

## 2017-03-30 DIAGNOSIS — C9000 Multiple myeloma not having achieved remission: Secondary | ICD-10-CM | POA: Diagnosis not present

## 2017-03-30 DIAGNOSIS — D61818 Other pancytopenia: Secondary | ICD-10-CM | POA: Diagnosis not present

## 2017-03-30 DIAGNOSIS — M48061 Spinal stenosis, lumbar region without neurogenic claudication: Secondary | ICD-10-CM | POA: Diagnosis not present

## 2017-03-30 DIAGNOSIS — I129 Hypertensive chronic kidney disease with stage 1 through stage 4 chronic kidney disease, or unspecified chronic kidney disease: Secondary | ICD-10-CM | POA: Diagnosis not present

## 2017-03-30 DIAGNOSIS — M79606 Pain in leg, unspecified: Secondary | ICD-10-CM | POA: Diagnosis not present

## 2017-03-30 DIAGNOSIS — Z9484 Stem cells transplant status: Secondary | ICD-10-CM | POA: Diagnosis not present

## 2017-03-30 DIAGNOSIS — R21 Rash and other nonspecific skin eruption: Secondary | ICD-10-CM | POA: Diagnosis not present

## 2017-03-30 DIAGNOSIS — R197 Diarrhea, unspecified: Secondary | ICD-10-CM | POA: Diagnosis not present

## 2017-03-30 DIAGNOSIS — M549 Dorsalgia, unspecified: Secondary | ICD-10-CM | POA: Diagnosis not present

## 2017-03-30 DIAGNOSIS — Z79899 Other long term (current) drug therapy: Secondary | ICD-10-CM | POA: Diagnosis not present

## 2017-03-30 DIAGNOSIS — E559 Vitamin D deficiency, unspecified: Secondary | ICD-10-CM | POA: Diagnosis not present

## 2017-03-30 DIAGNOSIS — E785 Hyperlipidemia, unspecified: Secondary | ICD-10-CM | POA: Diagnosis not present

## 2017-04-08 DIAGNOSIS — C9000 Multiple myeloma not having achieved remission: Secondary | ICD-10-CM | POA: Diagnosis not present

## 2017-04-15 DIAGNOSIS — C9000 Multiple myeloma not having achieved remission: Secondary | ICD-10-CM | POA: Diagnosis not present

## 2017-04-22 DIAGNOSIS — C9000 Multiple myeloma not having achieved remission: Secondary | ICD-10-CM | POA: Diagnosis not present

## 2017-04-29 DIAGNOSIS — N183 Chronic kidney disease, stage 3 (moderate): Secondary | ICD-10-CM | POA: Diagnosis not present

## 2017-04-29 DIAGNOSIS — G629 Polyneuropathy, unspecified: Secondary | ICD-10-CM | POA: Diagnosis not present

## 2017-04-29 DIAGNOSIS — C9000 Multiple myeloma not having achieved remission: Secondary | ICD-10-CM | POA: Diagnosis not present

## 2017-05-06 DIAGNOSIS — C9000 Multiple myeloma not having achieved remission: Secondary | ICD-10-CM | POA: Diagnosis not present

## 2017-05-13 DIAGNOSIS — C9000 Multiple myeloma not having achieved remission: Secondary | ICD-10-CM | POA: Diagnosis not present

## 2017-05-13 DIAGNOSIS — C9001 Multiple myeloma in remission: Secondary | ICD-10-CM | POA: Diagnosis not present

## 2017-05-18 DIAGNOSIS — C9000 Multiple myeloma not having achieved remission: Secondary | ICD-10-CM | POA: Diagnosis not present

## 2017-05-27 DIAGNOSIS — M109 Gout, unspecified: Secondary | ICD-10-CM | POA: Diagnosis not present

## 2017-05-27 DIAGNOSIS — D61818 Other pancytopenia: Secondary | ICD-10-CM | POA: Diagnosis not present

## 2017-05-27 DIAGNOSIS — D539 Nutritional anemia, unspecified: Secondary | ICD-10-CM | POA: Diagnosis not present

## 2017-05-27 DIAGNOSIS — M5136 Other intervertebral disc degeneration, lumbar region: Secondary | ICD-10-CM | POA: Diagnosis not present

## 2017-05-27 DIAGNOSIS — Z79899 Other long term (current) drug therapy: Secondary | ICD-10-CM | POA: Diagnosis not present

## 2017-05-27 DIAGNOSIS — C9000 Multiple myeloma not having achieved remission: Secondary | ICD-10-CM | POA: Diagnosis not present

## 2017-05-27 DIAGNOSIS — M79604 Pain in right leg: Secondary | ICD-10-CM | POA: Diagnosis not present

## 2017-05-27 DIAGNOSIS — I129 Hypertensive chronic kidney disease with stage 1 through stage 4 chronic kidney disease, or unspecified chronic kidney disease: Secondary | ICD-10-CM | POA: Diagnosis not present

## 2017-05-27 DIAGNOSIS — E785 Hyperlipidemia, unspecified: Secondary | ICD-10-CM | POA: Diagnosis not present

## 2017-05-27 DIAGNOSIS — E559 Vitamin D deficiency, unspecified: Secondary | ICD-10-CM | POA: Diagnosis not present

## 2017-05-27 DIAGNOSIS — M48061 Spinal stenosis, lumbar region without neurogenic claudication: Secondary | ICD-10-CM | POA: Diagnosis not present

## 2017-05-27 DIAGNOSIS — Z5111 Encounter for antineoplastic chemotherapy: Secondary | ICD-10-CM | POA: Diagnosis not present

## 2017-05-27 DIAGNOSIS — N183 Chronic kidney disease, stage 3 (moderate): Secondary | ICD-10-CM | POA: Diagnosis not present

## 2017-05-27 DIAGNOSIS — Z5112 Encounter for antineoplastic immunotherapy: Secondary | ICD-10-CM | POA: Diagnosis not present

## 2017-05-27 DIAGNOSIS — R21 Rash and other nonspecific skin eruption: Secondary | ICD-10-CM | POA: Diagnosis not present

## 2017-05-28 DIAGNOSIS — Z5112 Encounter for antineoplastic immunotherapy: Secondary | ICD-10-CM | POA: Diagnosis not present

## 2017-05-28 DIAGNOSIS — C9000 Multiple myeloma not having achieved remission: Secondary | ICD-10-CM | POA: Diagnosis not present

## 2017-06-02 DIAGNOSIS — Z5112 Encounter for antineoplastic immunotherapy: Secondary | ICD-10-CM | POA: Diagnosis not present

## 2017-06-02 DIAGNOSIS — C9000 Multiple myeloma not having achieved remission: Secondary | ICD-10-CM | POA: Diagnosis not present

## 2017-06-02 DIAGNOSIS — Z6741 Type O blood, Rh negative: Secondary | ICD-10-CM | POA: Diagnosis not present

## 2017-06-02 DIAGNOSIS — R768 Other specified abnormal immunological findings in serum: Secondary | ICD-10-CM | POA: Diagnosis not present

## 2017-06-10 DIAGNOSIS — C9000 Multiple myeloma not having achieved remission: Secondary | ICD-10-CM | POA: Diagnosis not present

## 2017-06-10 DIAGNOSIS — Z5112 Encounter for antineoplastic immunotherapy: Secondary | ICD-10-CM | POA: Diagnosis not present

## 2017-06-17 DIAGNOSIS — Z6741 Type O blood, Rh negative: Secondary | ICD-10-CM | POA: Diagnosis not present

## 2017-06-17 DIAGNOSIS — M48061 Spinal stenosis, lumbar region without neurogenic claudication: Secondary | ICD-10-CM | POA: Diagnosis not present

## 2017-06-17 DIAGNOSIS — D539 Nutritional anemia, unspecified: Secondary | ICD-10-CM | POA: Diagnosis not present

## 2017-06-17 DIAGNOSIS — R21 Rash and other nonspecific skin eruption: Secondary | ICD-10-CM | POA: Diagnosis not present

## 2017-06-17 DIAGNOSIS — C9000 Multiple myeloma not having achieved remission: Secondary | ICD-10-CM | POA: Diagnosis not present

## 2017-06-17 DIAGNOSIS — Z9189 Other specified personal risk factors, not elsewhere classified: Secondary | ICD-10-CM | POA: Diagnosis not present

## 2017-06-17 DIAGNOSIS — E559 Vitamin D deficiency, unspecified: Secondary | ICD-10-CM | POA: Diagnosis not present

## 2017-06-17 DIAGNOSIS — M5136 Other intervertebral disc degeneration, lumbar region: Secondary | ICD-10-CM | POA: Diagnosis not present

## 2017-06-17 DIAGNOSIS — Z9481 Bone marrow transplant status: Secondary | ICD-10-CM | POA: Diagnosis not present

## 2017-06-17 DIAGNOSIS — R29898 Other symptoms and signs involving the musculoskeletal system: Secondary | ICD-10-CM | POA: Diagnosis not present

## 2017-06-17 DIAGNOSIS — Z79899 Other long term (current) drug therapy: Secondary | ICD-10-CM | POA: Diagnosis not present

## 2017-06-17 DIAGNOSIS — N183 Chronic kidney disease, stage 3 (moderate): Secondary | ICD-10-CM | POA: Diagnosis not present

## 2017-06-17 DIAGNOSIS — C9002 Multiple myeloma in relapse: Secondary | ICD-10-CM | POA: Diagnosis not present

## 2017-06-17 DIAGNOSIS — Z5112 Encounter for antineoplastic immunotherapy: Secondary | ICD-10-CM | POA: Diagnosis not present

## 2017-06-17 DIAGNOSIS — D61818 Other pancytopenia: Secondary | ICD-10-CM | POA: Diagnosis not present

## 2017-06-17 DIAGNOSIS — I129 Hypertensive chronic kidney disease with stage 1 through stage 4 chronic kidney disease, or unspecified chronic kidney disease: Secondary | ICD-10-CM | POA: Diagnosis not present

## 2017-06-17 DIAGNOSIS — Z96641 Presence of right artificial hip joint: Secondary | ICD-10-CM | POA: Diagnosis not present

## 2017-06-17 DIAGNOSIS — R768 Other specified abnormal immunological findings in serum: Secondary | ICD-10-CM | POA: Diagnosis not present

## 2017-06-17 DIAGNOSIS — E785 Hyperlipidemia, unspecified: Secondary | ICD-10-CM | POA: Diagnosis not present

## 2017-06-17 DIAGNOSIS — M25551 Pain in right hip: Secondary | ICD-10-CM | POA: Diagnosis not present

## 2017-06-17 DIAGNOSIS — M79661 Pain in right lower leg: Secondary | ICD-10-CM | POA: Diagnosis not present

## 2017-06-24 DIAGNOSIS — N183 Chronic kidney disease, stage 3 (moderate): Secondary | ICD-10-CM | POA: Diagnosis not present

## 2017-06-24 DIAGNOSIS — C9000 Multiple myeloma not having achieved remission: Secondary | ICD-10-CM | POA: Diagnosis not present

## 2017-06-24 DIAGNOSIS — D539 Nutritional anemia, unspecified: Secondary | ICD-10-CM | POA: Diagnosis not present

## 2017-06-24 DIAGNOSIS — E785 Hyperlipidemia, unspecified: Secondary | ICD-10-CM | POA: Diagnosis not present

## 2017-06-24 DIAGNOSIS — I129 Hypertensive chronic kidney disease with stage 1 through stage 4 chronic kidney disease, or unspecified chronic kidney disease: Secondary | ICD-10-CM | POA: Diagnosis not present

## 2017-06-24 DIAGNOSIS — Z9484 Stem cells transplant status: Secondary | ICD-10-CM | POA: Diagnosis not present

## 2017-06-24 DIAGNOSIS — R05 Cough: Secondary | ICD-10-CM | POA: Diagnosis not present

## 2017-06-24 DIAGNOSIS — M109 Gout, unspecified: Secondary | ICD-10-CM | POA: Diagnosis not present

## 2017-06-24 DIAGNOSIS — M79604 Pain in right leg: Secondary | ICD-10-CM | POA: Diagnosis not present

## 2017-06-24 DIAGNOSIS — Z96641 Presence of right artificial hip joint: Secondary | ICD-10-CM | POA: Diagnosis not present

## 2017-06-24 DIAGNOSIS — M549 Dorsalgia, unspecified: Secondary | ICD-10-CM | POA: Diagnosis not present

## 2017-06-24 DIAGNOSIS — J069 Acute upper respiratory infection, unspecified: Secondary | ICD-10-CM | POA: Diagnosis not present

## 2017-06-24 DIAGNOSIS — M25551 Pain in right hip: Secondary | ICD-10-CM | POA: Diagnosis not present

## 2017-06-24 DIAGNOSIS — E559 Vitamin D deficiency, unspecified: Secondary | ICD-10-CM | POA: Diagnosis not present

## 2017-06-24 DIAGNOSIS — C9030 Solitary plasmacytoma not having achieved remission: Secondary | ICD-10-CM | POA: Diagnosis not present

## 2017-06-24 DIAGNOSIS — R531 Weakness: Secondary | ICD-10-CM | POA: Diagnosis not present

## 2017-06-24 DIAGNOSIS — D61818 Other pancytopenia: Secondary | ICD-10-CM | POA: Diagnosis not present

## 2017-07-01 DIAGNOSIS — C9000 Multiple myeloma not having achieved remission: Secondary | ICD-10-CM | POA: Diagnosis not present

## 2017-07-01 DIAGNOSIS — Z5112 Encounter for antineoplastic immunotherapy: Secondary | ICD-10-CM | POA: Diagnosis not present

## 2017-07-07 DIAGNOSIS — C9002 Multiple myeloma in relapse: Secondary | ICD-10-CM | POA: Diagnosis not present

## 2017-07-08 DIAGNOSIS — C9000 Multiple myeloma not having achieved remission: Secondary | ICD-10-CM | POA: Diagnosis not present

## 2017-07-08 DIAGNOSIS — Z6741 Type O blood, Rh negative: Secondary | ICD-10-CM | POA: Diagnosis not present

## 2017-07-08 DIAGNOSIS — R768 Other specified abnormal immunological findings in serum: Secondary | ICD-10-CM | POA: Diagnosis not present

## 2017-07-08 DIAGNOSIS — Z5112 Encounter for antineoplastic immunotherapy: Secondary | ICD-10-CM | POA: Diagnosis not present

## 2017-07-15 DIAGNOSIS — Z5112 Encounter for antineoplastic immunotherapy: Secondary | ICD-10-CM | POA: Diagnosis not present

## 2017-07-15 DIAGNOSIS — R768 Other specified abnormal immunological findings in serum: Secondary | ICD-10-CM | POA: Diagnosis not present

## 2017-07-15 DIAGNOSIS — N183 Chronic kidney disease, stage 3 (moderate): Secondary | ICD-10-CM | POA: Diagnosis not present

## 2017-07-15 DIAGNOSIS — I10 Essential (primary) hypertension: Secondary | ICD-10-CM | POA: Diagnosis not present

## 2017-07-15 DIAGNOSIS — M545 Low back pain: Secondary | ICD-10-CM | POA: Diagnosis not present

## 2017-07-15 DIAGNOSIS — Z9484 Stem cells transplant status: Secondary | ICD-10-CM | POA: Diagnosis not present

## 2017-07-15 DIAGNOSIS — D539 Nutritional anemia, unspecified: Secondary | ICD-10-CM | POA: Diagnosis not present

## 2017-07-15 DIAGNOSIS — Z8739 Personal history of other diseases of the musculoskeletal system and connective tissue: Secondary | ICD-10-CM | POA: Diagnosis not present

## 2017-07-15 DIAGNOSIS — G893 Neoplasm related pain (acute) (chronic): Secondary | ICD-10-CM | POA: Diagnosis not present

## 2017-07-15 DIAGNOSIS — E785 Hyperlipidemia, unspecified: Secondary | ICD-10-CM | POA: Diagnosis not present

## 2017-07-15 DIAGNOSIS — Z6741 Type O blood, Rh negative: Secondary | ICD-10-CM | POA: Diagnosis not present

## 2017-07-15 DIAGNOSIS — E559 Vitamin D deficiency, unspecified: Secondary | ICD-10-CM | POA: Diagnosis not present

## 2017-07-15 DIAGNOSIS — M48061 Spinal stenosis, lumbar region without neurogenic claudication: Secondary | ICD-10-CM | POA: Diagnosis not present

## 2017-07-15 DIAGNOSIS — C9000 Multiple myeloma not having achieved remission: Secondary | ICD-10-CM | POA: Diagnosis not present

## 2017-07-15 DIAGNOSIS — K5903 Drug induced constipation: Secondary | ICD-10-CM | POA: Diagnosis not present

## 2017-07-15 DIAGNOSIS — M79604 Pain in right leg: Secondary | ICD-10-CM | POA: Diagnosis not present

## 2017-07-15 DIAGNOSIS — I129 Hypertensive chronic kidney disease with stage 1 through stage 4 chronic kidney disease, or unspecified chronic kidney disease: Secondary | ICD-10-CM | POA: Diagnosis not present

## 2017-07-15 DIAGNOSIS — C9001 Multiple myeloma in remission: Secondary | ICD-10-CM | POA: Diagnosis not present

## 2017-07-15 DIAGNOSIS — R21 Rash and other nonspecific skin eruption: Secondary | ICD-10-CM | POA: Diagnosis not present

## 2017-07-15 DIAGNOSIS — T402X5A Adverse effect of other opioids, initial encounter: Secondary | ICD-10-CM | POA: Diagnosis not present

## 2017-07-22 DIAGNOSIS — M25551 Pain in right hip: Secondary | ICD-10-CM | POA: Diagnosis not present

## 2017-07-22 DIAGNOSIS — R768 Other specified abnormal immunological findings in serum: Secondary | ICD-10-CM | POA: Diagnosis not present

## 2017-07-22 DIAGNOSIS — Z5112 Encounter for antineoplastic immunotherapy: Secondary | ICD-10-CM | POA: Diagnosis not present

## 2017-07-22 DIAGNOSIS — Z9484 Stem cells transplant status: Secondary | ICD-10-CM | POA: Diagnosis not present

## 2017-07-22 DIAGNOSIS — M533 Sacrococcygeal disorders, not elsewhere classified: Secondary | ICD-10-CM | POA: Diagnosis not present

## 2017-07-22 DIAGNOSIS — C9000 Multiple myeloma not having achieved remission: Secondary | ICD-10-CM | POA: Diagnosis not present

## 2017-07-26 DIAGNOSIS — C9000 Multiple myeloma not having achieved remission: Secondary | ICD-10-CM | POA: Diagnosis not present

## 2017-07-27 DIAGNOSIS — C9000 Multiple myeloma not having achieved remission: Secondary | ICD-10-CM | POA: Diagnosis not present

## 2017-07-28 DIAGNOSIS — G893 Neoplasm related pain (acute) (chronic): Secondary | ICD-10-CM | POA: Diagnosis not present

## 2017-07-28 DIAGNOSIS — R768 Other specified abnormal immunological findings in serum: Secondary | ICD-10-CM | POA: Diagnosis not present

## 2017-07-28 DIAGNOSIS — T402X5A Adverse effect of other opioids, initial encounter: Secondary | ICD-10-CM | POA: Diagnosis not present

## 2017-07-28 DIAGNOSIS — R05 Cough: Secondary | ICD-10-CM | POA: Diagnosis not present

## 2017-07-28 DIAGNOSIS — K5903 Drug induced constipation: Secondary | ICD-10-CM | POA: Diagnosis not present

## 2017-07-28 DIAGNOSIS — C9000 Multiple myeloma not having achieved remission: Secondary | ICD-10-CM | POA: Diagnosis not present

## 2017-07-29 DIAGNOSIS — C9000 Multiple myeloma not having achieved remission: Secondary | ICD-10-CM | POA: Diagnosis not present

## 2017-07-30 DIAGNOSIS — C9000 Multiple myeloma not having achieved remission: Secondary | ICD-10-CM | POA: Diagnosis not present

## 2017-08-03 DIAGNOSIS — C9000 Multiple myeloma not having achieved remission: Secondary | ICD-10-CM | POA: Diagnosis not present

## 2017-08-04 DIAGNOSIS — C9000 Multiple myeloma not having achieved remission: Secondary | ICD-10-CM | POA: Diagnosis not present

## 2017-08-05 DIAGNOSIS — C9000 Multiple myeloma not having achieved remission: Secondary | ICD-10-CM | POA: Diagnosis not present

## 2017-08-06 DIAGNOSIS — C9000 Multiple myeloma not having achieved remission: Secondary | ICD-10-CM | POA: Diagnosis not present

## 2017-08-09 DIAGNOSIS — C9000 Multiple myeloma not having achieved remission: Secondary | ICD-10-CM | POA: Diagnosis not present

## 2017-08-10 DIAGNOSIS — C9000 Multiple myeloma not having achieved remission: Secondary | ICD-10-CM | POA: Diagnosis not present

## 2017-08-11 DIAGNOSIS — C9000 Multiple myeloma not having achieved remission: Secondary | ICD-10-CM | POA: Diagnosis not present

## 2017-08-12 DIAGNOSIS — G893 Neoplasm related pain (acute) (chronic): Secondary | ICD-10-CM | POA: Diagnosis not present

## 2017-08-12 DIAGNOSIS — I129 Hypertensive chronic kidney disease with stage 1 through stage 4 chronic kidney disease, or unspecified chronic kidney disease: Secondary | ICD-10-CM | POA: Diagnosis not present

## 2017-08-12 DIAGNOSIS — M109 Gout, unspecified: Secondary | ICD-10-CM | POA: Diagnosis not present

## 2017-08-12 DIAGNOSIS — M25551 Pain in right hip: Secondary | ICD-10-CM | POA: Diagnosis not present

## 2017-08-12 DIAGNOSIS — Z7982 Long term (current) use of aspirin: Secondary | ICD-10-CM | POA: Diagnosis not present

## 2017-08-12 DIAGNOSIS — M549 Dorsalgia, unspecified: Secondary | ICD-10-CM | POA: Diagnosis not present

## 2017-08-12 DIAGNOSIS — C9000 Multiple myeloma not having achieved remission: Secondary | ICD-10-CM | POA: Diagnosis not present

## 2017-08-12 DIAGNOSIS — N183 Chronic kidney disease, stage 3 (moderate): Secondary | ICD-10-CM | POA: Diagnosis not present

## 2017-08-12 DIAGNOSIS — Z9484 Stem cells transplant status: Secondary | ICD-10-CM | POA: Diagnosis not present

## 2017-08-12 DIAGNOSIS — M79604 Pain in right leg: Secondary | ICD-10-CM | POA: Diagnosis not present

## 2017-08-12 DIAGNOSIS — R531 Weakness: Secondary | ICD-10-CM | POA: Diagnosis not present

## 2017-08-12 DIAGNOSIS — D539 Nutritional anemia, unspecified: Secondary | ICD-10-CM | POA: Diagnosis not present

## 2017-08-12 DIAGNOSIS — E559 Vitamin D deficiency, unspecified: Secondary | ICD-10-CM | POA: Diagnosis not present

## 2017-08-12 DIAGNOSIS — Z510 Encounter for antineoplastic radiation therapy: Secondary | ICD-10-CM | POA: Diagnosis not present

## 2017-08-12 DIAGNOSIS — Z79899 Other long term (current) drug therapy: Secondary | ICD-10-CM | POA: Diagnosis not present

## 2017-08-12 DIAGNOSIS — E785 Hyperlipidemia, unspecified: Secondary | ICD-10-CM | POA: Diagnosis not present

## 2017-08-13 DIAGNOSIS — C9000 Multiple myeloma not having achieved remission: Secondary | ICD-10-CM | POA: Diagnosis not present

## 2017-08-19 DIAGNOSIS — R21 Rash and other nonspecific skin eruption: Secondary | ICD-10-CM | POA: Diagnosis not present

## 2017-08-19 DIAGNOSIS — Z5112 Encounter for antineoplastic immunotherapy: Secondary | ICD-10-CM | POA: Diagnosis not present

## 2017-08-19 DIAGNOSIS — I10 Essential (primary) hypertension: Secondary | ICD-10-CM | POA: Diagnosis not present

## 2017-08-19 DIAGNOSIS — R102 Pelvic and perineal pain: Secondary | ICD-10-CM | POA: Diagnosis not present

## 2017-08-19 DIAGNOSIS — R768 Other specified abnormal immunological findings in serum: Secondary | ICD-10-CM | POA: Diagnosis not present

## 2017-08-19 DIAGNOSIS — D61818 Other pancytopenia: Secondary | ICD-10-CM | POA: Diagnosis not present

## 2017-08-19 DIAGNOSIS — Z6741 Type O blood, Rh negative: Secondary | ICD-10-CM | POA: Diagnosis not present

## 2017-08-19 DIAGNOSIS — M79661 Pain in right lower leg: Secondary | ICD-10-CM | POA: Diagnosis not present

## 2017-08-19 DIAGNOSIS — I129 Hypertensive chronic kidney disease with stage 1 through stage 4 chronic kidney disease, or unspecified chronic kidney disease: Secondary | ICD-10-CM | POA: Diagnosis not present

## 2017-08-19 DIAGNOSIS — M48061 Spinal stenosis, lumbar region without neurogenic claudication: Secondary | ICD-10-CM | POA: Diagnosis not present

## 2017-08-19 DIAGNOSIS — R531 Weakness: Secondary | ICD-10-CM | POA: Diagnosis not present

## 2017-08-19 DIAGNOSIS — E785 Hyperlipidemia, unspecified: Secondary | ICD-10-CM | POA: Diagnosis not present

## 2017-08-19 DIAGNOSIS — M25551 Pain in right hip: Secondary | ICD-10-CM | POA: Diagnosis not present

## 2017-08-19 DIAGNOSIS — M549 Dorsalgia, unspecified: Secondary | ICD-10-CM | POA: Diagnosis not present

## 2017-08-19 DIAGNOSIS — M5136 Other intervertebral disc degeneration, lumbar region: Secondary | ICD-10-CM | POA: Diagnosis not present

## 2017-08-19 DIAGNOSIS — E559 Vitamin D deficiency, unspecified: Secondary | ICD-10-CM | POA: Diagnosis not present

## 2017-08-19 DIAGNOSIS — Z79899 Other long term (current) drug therapy: Secondary | ICD-10-CM | POA: Diagnosis not present

## 2017-08-19 DIAGNOSIS — C9000 Multiple myeloma not having achieved remission: Secondary | ICD-10-CM | POA: Diagnosis not present

## 2017-08-19 DIAGNOSIS — N183 Chronic kidney disease, stage 3 (moderate): Secondary | ICD-10-CM | POA: Diagnosis not present

## 2017-08-19 DIAGNOSIS — D539 Nutritional anemia, unspecified: Secondary | ICD-10-CM | POA: Diagnosis not present

## 2017-08-31 DIAGNOSIS — Z5112 Encounter for antineoplastic immunotherapy: Secondary | ICD-10-CM | POA: Diagnosis not present

## 2017-08-31 DIAGNOSIS — C9000 Multiple myeloma not having achieved remission: Secondary | ICD-10-CM | POA: Diagnosis not present

## 2017-09-14 DIAGNOSIS — Z9484 Stem cells transplant status: Secondary | ICD-10-CM | POA: Diagnosis not present

## 2017-09-14 DIAGNOSIS — D539 Nutritional anemia, unspecified: Secondary | ICD-10-CM | POA: Diagnosis not present

## 2017-09-14 DIAGNOSIS — R29898 Other symptoms and signs involving the musculoskeletal system: Secondary | ICD-10-CM | POA: Diagnosis not present

## 2017-09-14 DIAGNOSIS — C9002 Multiple myeloma in relapse: Secondary | ICD-10-CM | POA: Diagnosis not present

## 2017-09-14 DIAGNOSIS — M25551 Pain in right hip: Secondary | ICD-10-CM | POA: Diagnosis not present

## 2017-09-14 DIAGNOSIS — Z79899 Other long term (current) drug therapy: Secondary | ICD-10-CM | POA: Diagnosis not present

## 2017-09-14 DIAGNOSIS — Z5112 Encounter for antineoplastic immunotherapy: Secondary | ICD-10-CM | POA: Diagnosis not present

## 2017-09-14 DIAGNOSIS — E785 Hyperlipidemia, unspecified: Secondary | ICD-10-CM | POA: Diagnosis not present

## 2017-09-14 DIAGNOSIS — E559 Vitamin D deficiency, unspecified: Secondary | ICD-10-CM | POA: Diagnosis not present

## 2017-09-14 DIAGNOSIS — M79604 Pain in right leg: Secondary | ICD-10-CM | POA: Diagnosis not present

## 2017-09-14 DIAGNOSIS — C9000 Multiple myeloma not having achieved remission: Secondary | ICD-10-CM | POA: Diagnosis not present

## 2017-09-14 DIAGNOSIS — K5900 Constipation, unspecified: Secondary | ICD-10-CM | POA: Diagnosis not present

## 2017-09-14 DIAGNOSIS — I129 Hypertensive chronic kidney disease with stage 1 through stage 4 chronic kidney disease, or unspecified chronic kidney disease: Secondary | ICD-10-CM | POA: Diagnosis not present

## 2017-09-14 DIAGNOSIS — R5383 Other fatigue: Secondary | ICD-10-CM | POA: Diagnosis not present

## 2017-09-14 DIAGNOSIS — N183 Chronic kidney disease, stage 3 (moderate): Secondary | ICD-10-CM | POA: Diagnosis not present

## 2017-09-14 DIAGNOSIS — R21 Rash and other nonspecific skin eruption: Secondary | ICD-10-CM | POA: Diagnosis not present

## 2017-09-14 DIAGNOSIS — Z6741 Type O blood, Rh negative: Secondary | ICD-10-CM | POA: Diagnosis not present

## 2017-09-14 DIAGNOSIS — M48061 Spinal stenosis, lumbar region without neurogenic claudication: Secondary | ICD-10-CM | POA: Diagnosis not present

## 2017-09-14 DIAGNOSIS — D61818 Other pancytopenia: Secondary | ICD-10-CM | POA: Diagnosis not present

## 2017-09-28 DIAGNOSIS — Z9481 Bone marrow transplant status: Secondary | ICD-10-CM | POA: Diagnosis not present

## 2017-09-28 DIAGNOSIS — Z5112 Encounter for antineoplastic immunotherapy: Secondary | ICD-10-CM | POA: Diagnosis not present

## 2017-09-28 DIAGNOSIS — C9000 Multiple myeloma not having achieved remission: Secondary | ICD-10-CM | POA: Diagnosis not present

## 2017-09-28 DIAGNOSIS — Z23 Encounter for immunization: Secondary | ICD-10-CM | POA: Diagnosis not present

## 2017-10-12 DIAGNOSIS — Z79899 Other long term (current) drug therapy: Secondary | ICD-10-CM | POA: Diagnosis not present

## 2017-10-12 DIAGNOSIS — M109 Gout, unspecified: Secondary | ICD-10-CM | POA: Diagnosis not present

## 2017-10-12 DIAGNOSIS — R29898 Other symptoms and signs involving the musculoskeletal system: Secondary | ICD-10-CM | POA: Diagnosis not present

## 2017-10-12 DIAGNOSIS — D539 Nutritional anemia, unspecified: Secondary | ICD-10-CM | POA: Diagnosis not present

## 2017-10-12 DIAGNOSIS — Z6741 Type O blood, Rh negative: Secondary | ICD-10-CM | POA: Diagnosis not present

## 2017-10-12 DIAGNOSIS — R5383 Other fatigue: Secondary | ICD-10-CM | POA: Diagnosis not present

## 2017-10-12 DIAGNOSIS — M48061 Spinal stenosis, lumbar region without neurogenic claudication: Secondary | ICD-10-CM | POA: Diagnosis not present

## 2017-10-12 DIAGNOSIS — Z5112 Encounter for antineoplastic immunotherapy: Secondary | ICD-10-CM | POA: Diagnosis not present

## 2017-10-12 DIAGNOSIS — M5136 Other intervertebral disc degeneration, lumbar region: Secondary | ICD-10-CM | POA: Diagnosis not present

## 2017-10-12 DIAGNOSIS — G629 Polyneuropathy, unspecified: Secondary | ICD-10-CM | POA: Diagnosis not present

## 2017-10-12 DIAGNOSIS — D61818 Other pancytopenia: Secondary | ICD-10-CM | POA: Diagnosis not present

## 2017-10-12 DIAGNOSIS — M79604 Pain in right leg: Secondary | ICD-10-CM | POA: Diagnosis not present

## 2017-10-12 DIAGNOSIS — E785 Hyperlipidemia, unspecified: Secondary | ICD-10-CM | POA: Diagnosis not present

## 2017-10-12 DIAGNOSIS — I10 Essential (primary) hypertension: Secondary | ICD-10-CM | POA: Diagnosis not present

## 2017-10-12 DIAGNOSIS — Z923 Personal history of irradiation: Secondary | ICD-10-CM | POA: Diagnosis not present

## 2017-10-12 DIAGNOSIS — C9000 Multiple myeloma not having achieved remission: Secondary | ICD-10-CM | POA: Diagnosis not present

## 2017-10-26 DIAGNOSIS — Z5112 Encounter for antineoplastic immunotherapy: Secondary | ICD-10-CM | POA: Diagnosis not present

## 2017-10-26 DIAGNOSIS — Z79899 Other long term (current) drug therapy: Secondary | ICD-10-CM | POA: Diagnosis not present

## 2017-10-26 DIAGNOSIS — Z5181 Encounter for therapeutic drug level monitoring: Secondary | ICD-10-CM | POA: Diagnosis not present

## 2017-10-26 DIAGNOSIS — C9000 Multiple myeloma not having achieved remission: Secondary | ICD-10-CM | POA: Diagnosis not present

## 2017-11-09 DIAGNOSIS — Z79899 Other long term (current) drug therapy: Secondary | ICD-10-CM | POA: Diagnosis not present

## 2017-11-09 DIAGNOSIS — M109 Gout, unspecified: Secondary | ICD-10-CM | POA: Diagnosis not present

## 2017-11-09 DIAGNOSIS — M48061 Spinal stenosis, lumbar region without neurogenic claudication: Secondary | ICD-10-CM | POA: Diagnosis not present

## 2017-11-09 DIAGNOSIS — Z9484 Stem cells transplant status: Secondary | ICD-10-CM | POA: Diagnosis not present

## 2017-11-09 DIAGNOSIS — Z5112 Encounter for antineoplastic immunotherapy: Secondary | ICD-10-CM | POA: Diagnosis not present

## 2017-11-09 DIAGNOSIS — E559 Vitamin D deficiency, unspecified: Secondary | ICD-10-CM | POA: Diagnosis not present

## 2017-11-09 DIAGNOSIS — I129 Hypertensive chronic kidney disease with stage 1 through stage 4 chronic kidney disease, or unspecified chronic kidney disease: Secondary | ICD-10-CM | POA: Diagnosis not present

## 2017-11-09 DIAGNOSIS — Z923 Personal history of irradiation: Secondary | ICD-10-CM | POA: Diagnosis not present

## 2017-11-09 DIAGNOSIS — M79604 Pain in right leg: Secondary | ICD-10-CM | POA: Diagnosis not present

## 2017-11-09 DIAGNOSIS — R21 Rash and other nonspecific skin eruption: Secondary | ICD-10-CM | POA: Diagnosis not present

## 2017-11-09 DIAGNOSIS — C9000 Multiple myeloma not having achieved remission: Secondary | ICD-10-CM | POA: Diagnosis not present

## 2017-11-09 DIAGNOSIS — R600 Localized edema: Secondary | ICD-10-CM | POA: Diagnosis not present

## 2017-11-09 DIAGNOSIS — E785 Hyperlipidemia, unspecified: Secondary | ICD-10-CM | POA: Diagnosis not present

## 2017-11-09 DIAGNOSIS — Z6741 Type O blood, Rh negative: Secondary | ICD-10-CM | POA: Diagnosis not present

## 2017-11-09 DIAGNOSIS — N183 Chronic kidney disease, stage 3 (moderate): Secondary | ICD-10-CM | POA: Diagnosis not present

## 2017-11-09 DIAGNOSIS — C9002 Multiple myeloma in relapse: Secondary | ICD-10-CM | POA: Diagnosis not present

## 2017-11-09 DIAGNOSIS — D539 Nutritional anemia, unspecified: Secondary | ICD-10-CM | POA: Diagnosis not present

## 2017-11-18 DIAGNOSIS — C9000 Multiple myeloma not having achieved remission: Secondary | ICD-10-CM | POA: Diagnosis not present

## 2017-11-18 DIAGNOSIS — C9002 Multiple myeloma in relapse: Secondary | ICD-10-CM | POA: Diagnosis not present

## 2017-11-23 DIAGNOSIS — Z5112 Encounter for antineoplastic immunotherapy: Secondary | ICD-10-CM | POA: Diagnosis not present

## 2017-11-23 DIAGNOSIS — Z6741 Type O blood, Rh negative: Secondary | ICD-10-CM | POA: Diagnosis not present

## 2017-11-23 DIAGNOSIS — C9000 Multiple myeloma not having achieved remission: Secondary | ICD-10-CM | POA: Diagnosis not present

## 2017-12-03 DIAGNOSIS — C9002 Multiple myeloma in relapse: Secondary | ICD-10-CM | POA: Diagnosis not present

## 2017-12-03 DIAGNOSIS — R5383 Other fatigue: Secondary | ICD-10-CM | POA: Diagnosis not present

## 2017-12-03 DIAGNOSIS — I129 Hypertensive chronic kidney disease with stage 1 through stage 4 chronic kidney disease, or unspecified chronic kidney disease: Secondary | ICD-10-CM | POA: Diagnosis not present

## 2017-12-03 DIAGNOSIS — Z5112 Encounter for antineoplastic immunotherapy: Secondary | ICD-10-CM | POA: Diagnosis not present

## 2017-12-03 DIAGNOSIS — N183 Chronic kidney disease, stage 3 (moderate): Secondary | ICD-10-CM | POA: Diagnosis not present

## 2017-12-03 DIAGNOSIS — D539 Nutritional anemia, unspecified: Secondary | ICD-10-CM | POA: Diagnosis not present

## 2017-12-03 DIAGNOSIS — R600 Localized edema: Secondary | ICD-10-CM | POA: Diagnosis not present

## 2017-12-03 DIAGNOSIS — E785 Hyperlipidemia, unspecified: Secondary | ICD-10-CM | POA: Diagnosis not present

## 2017-12-03 DIAGNOSIS — Z6741 Type O blood, Rh negative: Secondary | ICD-10-CM | POA: Diagnosis not present

## 2017-12-03 DIAGNOSIS — Z7982 Long term (current) use of aspirin: Secondary | ICD-10-CM | POA: Diagnosis not present

## 2017-12-03 DIAGNOSIS — E559 Vitamin D deficiency, unspecified: Secondary | ICD-10-CM | POA: Diagnosis not present

## 2017-12-03 DIAGNOSIS — M5136 Other intervertebral disc degeneration, lumbar region: Secondary | ICD-10-CM | POA: Diagnosis not present

## 2017-12-03 DIAGNOSIS — M109 Gout, unspecified: Secondary | ICD-10-CM | POA: Diagnosis not present

## 2017-12-03 DIAGNOSIS — D649 Anemia, unspecified: Secondary | ICD-10-CM | POA: Diagnosis not present

## 2017-12-03 DIAGNOSIS — Z923 Personal history of irradiation: Secondary | ICD-10-CM | POA: Diagnosis not present

## 2017-12-03 DIAGNOSIS — M79604 Pain in right leg: Secondary | ICD-10-CM | POA: Diagnosis not present

## 2017-12-03 DIAGNOSIS — M48061 Spinal stenosis, lumbar region without neurogenic claudication: Secondary | ICD-10-CM | POA: Diagnosis not present

## 2017-12-03 DIAGNOSIS — Z79899 Other long term (current) drug therapy: Secondary | ICD-10-CM | POA: Diagnosis not present

## 2017-12-03 DIAGNOSIS — Z9484 Stem cells transplant status: Secondary | ICD-10-CM | POA: Diagnosis not present

## 2017-12-03 DIAGNOSIS — C9000 Multiple myeloma not having achieved remission: Secondary | ICD-10-CM | POA: Diagnosis not present

## 2017-12-16 DIAGNOSIS — K838 Other specified diseases of biliary tract: Secondary | ICD-10-CM | POA: Diagnosis not present

## 2017-12-16 DIAGNOSIS — I82411 Acute embolism and thrombosis of right femoral vein: Secondary | ICD-10-CM | POA: Diagnosis not present

## 2017-12-16 DIAGNOSIS — R7989 Other specified abnormal findings of blood chemistry: Secondary | ICD-10-CM | POA: Diagnosis not present

## 2017-12-16 DIAGNOSIS — K219 Gastro-esophageal reflux disease without esophagitis: Secondary | ICD-10-CM | POA: Diagnosis not present

## 2017-12-16 DIAGNOSIS — I82401 Acute embolism and thrombosis of unspecified deep veins of right lower extremity: Secondary | ICD-10-CM | POA: Diagnosis not present

## 2017-12-16 DIAGNOSIS — M48061 Spinal stenosis, lumbar region without neurogenic claudication: Secondary | ICD-10-CM | POA: Diagnosis not present

## 2017-12-16 DIAGNOSIS — N183 Chronic kidney disease, stage 3 (moderate): Secondary | ICD-10-CM | POA: Diagnosis not present

## 2017-12-16 DIAGNOSIS — D630 Anemia in neoplastic disease: Secondary | ICD-10-CM | POA: Diagnosis not present

## 2017-12-16 DIAGNOSIS — Z66 Do not resuscitate: Secondary | ICD-10-CM | POA: Diagnosis not present

## 2017-12-16 DIAGNOSIS — Z4659 Encounter for fitting and adjustment of other gastrointestinal appliance and device: Secondary | ICD-10-CM | POA: Diagnosis not present

## 2017-12-16 DIAGNOSIS — I771 Stricture of artery: Secondary | ICD-10-CM | POA: Diagnosis not present

## 2017-12-16 DIAGNOSIS — C9000 Multiple myeloma not having achieved remission: Secondary | ICD-10-CM | POA: Diagnosis not present

## 2017-12-16 DIAGNOSIS — E785 Hyperlipidemia, unspecified: Secondary | ICD-10-CM | POA: Diagnosis not present

## 2017-12-16 DIAGNOSIS — M5136 Other intervertebral disc degeneration, lumbar region: Secondary | ICD-10-CM | POA: Diagnosis not present

## 2017-12-16 DIAGNOSIS — K869 Disease of pancreas, unspecified: Secondary | ICD-10-CM | POA: Diagnosis not present

## 2017-12-16 DIAGNOSIS — R945 Abnormal results of liver function studies: Secondary | ICD-10-CM | POA: Diagnosis not present

## 2017-12-16 DIAGNOSIS — Z7982 Long term (current) use of aspirin: Secondary | ICD-10-CM | POA: Diagnosis not present

## 2017-12-16 DIAGNOSIS — R933 Abnormal findings on diagnostic imaging of other parts of digestive tract: Secondary | ICD-10-CM | POA: Diagnosis not present

## 2017-12-16 DIAGNOSIS — M109 Gout, unspecified: Secondary | ICD-10-CM | POA: Diagnosis not present

## 2017-12-16 DIAGNOSIS — D490 Neoplasm of unspecified behavior of digestive system: Secondary | ICD-10-CM | POA: Diagnosis not present

## 2017-12-16 DIAGNOSIS — I129 Hypertensive chronic kidney disease with stage 1 through stage 4 chronic kidney disease, or unspecified chronic kidney disease: Secondary | ICD-10-CM | POA: Diagnosis not present

## 2017-12-16 DIAGNOSIS — K8689 Other specified diseases of pancreas: Secondary | ICD-10-CM | POA: Diagnosis not present

## 2017-12-16 DIAGNOSIS — C9001 Multiple myeloma in remission: Secondary | ICD-10-CM | POA: Diagnosis not present

## 2017-12-16 DIAGNOSIS — I871 Compression of vein: Secondary | ICD-10-CM | POA: Diagnosis not present

## 2017-12-16 DIAGNOSIS — R1084 Generalized abdominal pain: Secondary | ICD-10-CM | POA: Diagnosis not present

## 2017-12-16 DIAGNOSIS — Z9481 Bone marrow transplant status: Secondary | ICD-10-CM | POA: Diagnosis not present

## 2017-12-16 DIAGNOSIS — R160 Hepatomegaly, not elsewhere classified: Secondary | ICD-10-CM | POA: Diagnosis not present

## 2017-12-16 DIAGNOSIS — K831 Obstruction of bile duct: Secondary | ICD-10-CM | POA: Diagnosis not present

## 2017-12-16 DIAGNOSIS — C9002 Multiple myeloma in relapse: Secondary | ICD-10-CM | POA: Diagnosis not present

## 2017-12-16 DIAGNOSIS — R748 Abnormal levels of other serum enzymes: Secondary | ICD-10-CM | POA: Diagnosis not present

## 2017-12-17 DIAGNOSIS — K8689 Other specified diseases of pancreas: Secondary | ICD-10-CM | POA: Diagnosis not present

## 2017-12-17 DIAGNOSIS — I129 Hypertensive chronic kidney disease with stage 1 through stage 4 chronic kidney disease, or unspecified chronic kidney disease: Secondary | ICD-10-CM | POA: Diagnosis not present

## 2017-12-17 DIAGNOSIS — N183 Chronic kidney disease, stage 3 (moderate): Secondary | ICD-10-CM | POA: Diagnosis not present

## 2017-12-17 DIAGNOSIS — R748 Abnormal levels of other serum enzymes: Secondary | ICD-10-CM | POA: Diagnosis not present

## 2017-12-17 DIAGNOSIS — R160 Hepatomegaly, not elsewhere classified: Secondary | ICD-10-CM | POA: Diagnosis not present

## 2017-12-23 DIAGNOSIS — C9000 Multiple myeloma not having achieved remission: Secondary | ICD-10-CM | POA: Diagnosis not present

## 2017-12-27 DIAGNOSIS — D4989 Neoplasm of unspecified behavior of other specified sites: Secondary | ICD-10-CM | POA: Diagnosis not present

## 2017-12-27 DIAGNOSIS — C9000 Multiple myeloma not having achieved remission: Secondary | ICD-10-CM | POA: Diagnosis not present

## 2017-12-27 DIAGNOSIS — R1909 Other intra-abdominal and pelvic swelling, mass and lump: Secondary | ICD-10-CM | POA: Diagnosis not present

## 2017-12-27 DIAGNOSIS — R1903 Right lower quadrant abdominal swelling, mass and lump: Secondary | ICD-10-CM | POA: Diagnosis not present

## 2018-01-04 DIAGNOSIS — C9002 Multiple myeloma in relapse: Secondary | ICD-10-CM | POA: Diagnosis not present

## 2018-01-05 DIAGNOSIS — C9000 Multiple myeloma not having achieved remission: Secondary | ICD-10-CM | POA: Diagnosis not present

## 2018-01-05 DIAGNOSIS — I082 Rheumatic disorders of both aortic and tricuspid valves: Secondary | ICD-10-CM | POA: Diagnosis not present

## 2018-01-05 DIAGNOSIS — I361 Nonrheumatic tricuspid (valve) insufficiency: Secondary | ICD-10-CM | POA: Diagnosis not present

## 2018-01-06 DIAGNOSIS — C9000 Multiple myeloma not having achieved remission: Secondary | ICD-10-CM | POA: Diagnosis not present

## 2018-01-06 DIAGNOSIS — Z5111 Encounter for antineoplastic chemotherapy: Secondary | ICD-10-CM | POA: Diagnosis not present

## 2018-01-06 DIAGNOSIS — C9002 Multiple myeloma in relapse: Secondary | ICD-10-CM | POA: Diagnosis not present

## 2018-01-11 DIAGNOSIS — C9002 Multiple myeloma in relapse: Secondary | ICD-10-CM | POA: Diagnosis not present

## 2018-01-11 DIAGNOSIS — E790 Hyperuricemia without signs of inflammatory arthritis and tophaceous disease: Secondary | ICD-10-CM | POA: Diagnosis not present

## 2018-01-11 DIAGNOSIS — M7989 Other specified soft tissue disorders: Secondary | ICD-10-CM | POA: Diagnosis not present

## 2018-01-11 DIAGNOSIS — Z86718 Personal history of other venous thrombosis and embolism: Secondary | ICD-10-CM | POA: Diagnosis not present

## 2018-01-11 DIAGNOSIS — Z7901 Long term (current) use of anticoagulants: Secondary | ICD-10-CM | POA: Diagnosis not present

## 2018-01-11 DIAGNOSIS — R140 Abdominal distension (gaseous): Secondary | ICD-10-CM | POA: Diagnosis not present

## 2018-01-14 DIAGNOSIS — C9000 Multiple myeloma not having achieved remission: Secondary | ICD-10-CM | POA: Diagnosis not present

## 2018-01-14 DIAGNOSIS — R0609 Other forms of dyspnea: Secondary | ICD-10-CM | POA: Diagnosis not present

## 2018-01-14 DIAGNOSIS — R140 Abdominal distension (gaseous): Secondary | ICD-10-CM | POA: Diagnosis not present

## 2018-01-14 DIAGNOSIS — I499 Cardiac arrhythmia, unspecified: Secondary | ICD-10-CM | POA: Diagnosis not present

## 2018-01-14 DIAGNOSIS — C9001 Multiple myeloma in remission: Secondary | ICD-10-CM | POA: Diagnosis not present

## 2018-01-14 DIAGNOSIS — C9002 Multiple myeloma in relapse: Secondary | ICD-10-CM | POA: Diagnosis not present

## 2018-01-14 DIAGNOSIS — R600 Localized edema: Secondary | ICD-10-CM | POA: Diagnosis not present

## 2018-01-18 DIAGNOSIS — C9002 Multiple myeloma in relapse: Secondary | ICD-10-CM | POA: Diagnosis not present

## 2018-01-18 DIAGNOSIS — Z5111 Encounter for antineoplastic chemotherapy: Secondary | ICD-10-CM | POA: Diagnosis not present

## 2018-01-18 DIAGNOSIS — R000 Tachycardia, unspecified: Secondary | ICD-10-CM | POA: Diagnosis not present

## 2018-01-18 DIAGNOSIS — R300 Dysuria: Secondary | ICD-10-CM | POA: Diagnosis not present

## 2018-01-18 DIAGNOSIS — I517 Cardiomegaly: Secondary | ICD-10-CM | POA: Diagnosis not present

## 2018-01-18 DIAGNOSIS — R600 Localized edema: Secondary | ICD-10-CM | POA: Diagnosis not present

## 2018-01-18 DIAGNOSIS — Z9221 Personal history of antineoplastic chemotherapy: Secondary | ICD-10-CM | POA: Diagnosis not present

## 2018-01-24 DIAGNOSIS — R1084 Generalized abdominal pain: Secondary | ICD-10-CM | POA: Diagnosis not present

## 2018-01-24 DIAGNOSIS — R9389 Abnormal findings on diagnostic imaging of other specified body structures: Secondary | ICD-10-CM | POA: Diagnosis not present

## 2018-01-24 DIAGNOSIS — N1330 Unspecified hydronephrosis: Secondary | ICD-10-CM | POA: Diagnosis not present

## 2018-01-24 DIAGNOSIS — C782 Secondary malignant neoplasm of pleura: Secondary | ICD-10-CM | POA: Diagnosis not present

## 2018-01-24 DIAGNOSIS — R9341 Abnormal radiologic findings on diagnostic imaging of renal pelvis, ureter, or bladder: Secondary | ICD-10-CM | POA: Diagnosis not present

## 2018-01-24 DIAGNOSIS — C9002 Multiple myeloma in relapse: Secondary | ICD-10-CM | POA: Diagnosis not present

## 2018-01-24 DIAGNOSIS — J90 Pleural effusion, not elsewhere classified: Secondary | ICD-10-CM | POA: Diagnosis not present

## 2018-01-24 DIAGNOSIS — Z7901 Long term (current) use of anticoagulants: Secondary | ICD-10-CM | POA: Diagnosis not present

## 2018-01-24 DIAGNOSIS — Z5111 Encounter for antineoplastic chemotherapy: Secondary | ICD-10-CM | POA: Diagnosis not present

## 2018-01-24 DIAGNOSIS — R600 Localized edema: Secondary | ICD-10-CM | POA: Diagnosis not present

## 2018-01-24 DIAGNOSIS — E790 Hyperuricemia without signs of inflammatory arthritis and tophaceous disease: Secondary | ICD-10-CM | POA: Diagnosis not present

## 2018-01-24 DIAGNOSIS — M7989 Other specified soft tissue disorders: Secondary | ICD-10-CM | POA: Diagnosis not present

## 2018-02-18 ENCOUNTER — Telehealth: Payer: Self-pay | Admitting: Family Medicine

## 2018-02-18 NOTE — Telephone Encounter (Signed)
Alwyn Ren, could you please update this mrn. The pt is deceased

## 2018-02-23 NOTE — Telephone Encounter (Signed)
Done. Thanks.

## 2018-03-13 DEATH — deceased
